# Patient Record
Sex: Female | Born: 1950 | Race: White | Hispanic: No | Marital: Married | Smoking: Never smoker
Health system: Southern US, Community
[De-identification: ages and names within clinical notes are randomized; demographics above are authoritative.]

## PROBLEM LIST (undated history)

## (undated) DIAGNOSIS — R112 Nausea with vomiting, unspecified: Secondary | ICD-10-CM

## (undated) DIAGNOSIS — E7800 Pure hypercholesterolemia, unspecified: Secondary | ICD-10-CM

## (undated) DIAGNOSIS — Z9889 Other specified postprocedural states: Secondary | ICD-10-CM

## (undated) DIAGNOSIS — T8859XA Other complications of anesthesia, initial encounter: Secondary | ICD-10-CM

## (undated) DIAGNOSIS — T4145XA Adverse effect of unspecified anesthetic, initial encounter: Secondary | ICD-10-CM

## (undated) DIAGNOSIS — I10 Essential (primary) hypertension: Secondary | ICD-10-CM

## (undated) HISTORY — PX: APPENDECTOMY: SHX54

## (undated) HISTORY — PX: ABDOMINAL HYSTERECTOMY: SHX81

---
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
## 2005-08-08 ENCOUNTER — Ambulatory Visit: Payer: Self-pay | Admitting: Unknown Physician Specialty

## 2005-08-26 ENCOUNTER — Ambulatory Visit: Payer: Self-pay | Admitting: Unknown Physician Specialty

## 2005-09-30 ENCOUNTER — Ambulatory Visit: Payer: Self-pay | Admitting: Obstetrics and Gynecology

## 2007-01-20 ENCOUNTER — Ambulatory Visit: Payer: Self-pay | Admitting: Obstetrics and Gynecology

## 2007-04-22 ENCOUNTER — Ambulatory Visit: Payer: Self-pay | Admitting: Obstetrics and Gynecology

## 2007-04-30 ENCOUNTER — Inpatient Hospital Stay: Payer: Self-pay | Admitting: Obstetrics and Gynecology

## 2007-07-17 ENCOUNTER — Ambulatory Visit: Payer: Self-pay | Admitting: Obstetrics and Gynecology

## 2008-02-13 ENCOUNTER — Inpatient Hospital Stay: Payer: Self-pay | Admitting: Internal Medicine

## 2008-02-13 ENCOUNTER — Other Ambulatory Visit: Payer: Self-pay

## 2008-03-16 ENCOUNTER — Ambulatory Visit: Payer: Self-pay | Admitting: Family Medicine

## 2009-06-06 ENCOUNTER — Ambulatory Visit: Payer: Self-pay | Admitting: Obstetrics and Gynecology

## 2010-08-27 ENCOUNTER — Ambulatory Visit: Payer: Self-pay | Admitting: Obstetrics and Gynecology

## 2011-10-16 ENCOUNTER — Ambulatory Visit: Payer: Self-pay | Admitting: Obstetrics and Gynecology

## 2011-11-05 HISTORY — PX: COLONOSCOPY: SHX174

## 2011-12-30 ENCOUNTER — Ambulatory Visit: Payer: Self-pay | Admitting: Gastroenterology

## 2012-10-20 ENCOUNTER — Ambulatory Visit: Payer: Self-pay | Admitting: Family Medicine

## 2013-11-16 ENCOUNTER — Ambulatory Visit: Payer: Self-pay | Admitting: Family Medicine

## 2014-08-08 DIAGNOSIS — E7800 Pure hypercholesterolemia, unspecified: Secondary | ICD-10-CM | POA: Insufficient documentation

## 2014-08-08 DIAGNOSIS — E119 Type 2 diabetes mellitus without complications: Secondary | ICD-10-CM | POA: Insufficient documentation

## 2015-02-23 ENCOUNTER — Ambulatory Visit
Admit: 2015-02-23 | Disposition: A | Discharge status: Home / Self Care | Payer: Self-pay | Attending: Family Medicine | Admitting: Family Medicine

## 2016-02-12 ENCOUNTER — Other Ambulatory Visit: Payer: Self-pay | Admitting: Family Medicine

## 2016-02-12 DIAGNOSIS — Z1231 Encounter for screening mammogram for malignant neoplasm of breast: Secondary | ICD-10-CM

## 2016-02-26 ENCOUNTER — Ambulatory Visit
Admission: RE | Admit: 2016-02-26 | Discharge: 2016-02-26 | Disposition: A | Discharge status: Home / Self Care | Payer: BLUE CROSS/BLUE SHIELD | Source: Ambulatory Visit | Attending: Family Medicine | Admitting: Family Medicine

## 2016-02-26 DIAGNOSIS — Z1231 Encounter for screening mammogram for malignant neoplasm of breast: Secondary | ICD-10-CM | POA: Diagnosis present

## 2017-02-13 ENCOUNTER — Other Ambulatory Visit: Payer: Self-pay | Admitting: Family Medicine

## 2017-02-13 DIAGNOSIS — Z1231 Encounter for screening mammogram for malignant neoplasm of breast: Secondary | ICD-10-CM

## 2017-03-10 ENCOUNTER — Ambulatory Visit
Admission: RE | Admit: 2017-03-10 | Discharge: 2017-03-10 | Disposition: A | Discharge status: Home / Self Care | Payer: BLUE CROSS/BLUE SHIELD | Source: Ambulatory Visit | Attending: Family Medicine | Admitting: Family Medicine

## 2017-03-10 DIAGNOSIS — Z1231 Encounter for screening mammogram for malignant neoplasm of breast: Secondary | ICD-10-CM | POA: Insufficient documentation

## 2017-08-04 DIAGNOSIS — R7303 Prediabetes: Secondary | ICD-10-CM | POA: Diagnosis not present

## 2017-08-04 DIAGNOSIS — E7800 Pure hypercholesterolemia, unspecified: Secondary | ICD-10-CM | POA: Diagnosis not present

## 2017-08-14 DIAGNOSIS — M79642 Pain in left hand: Secondary | ICD-10-CM | POA: Diagnosis not present

## 2017-08-14 DIAGNOSIS — E7800 Pure hypercholesterolemia, unspecified: Secondary | ICD-10-CM | POA: Diagnosis not present

## 2017-08-14 DIAGNOSIS — R7303 Prediabetes: Secondary | ICD-10-CM | POA: Diagnosis not present

## 2017-10-20 DIAGNOSIS — B372 Candidiasis of skin and nail: Secondary | ICD-10-CM | POA: Diagnosis not present

## 2017-11-12 DIAGNOSIS — L821 Other seborrheic keratosis: Secondary | ICD-10-CM | POA: Diagnosis not present

## 2017-11-12 DIAGNOSIS — L814 Other melanin hyperpigmentation: Secondary | ICD-10-CM | POA: Diagnosis not present

## 2018-02-09 ENCOUNTER — Other Ambulatory Visit: Payer: Self-pay | Admitting: Family Medicine

## 2018-02-09 DIAGNOSIS — E7800 Pure hypercholesterolemia, unspecified: Secondary | ICD-10-CM | POA: Diagnosis not present

## 2018-02-09 DIAGNOSIS — Z1231 Encounter for screening mammogram for malignant neoplasm of breast: Secondary | ICD-10-CM

## 2018-02-09 DIAGNOSIS — R7303 Prediabetes: Secondary | ICD-10-CM | POA: Diagnosis not present

## 2018-02-16 DIAGNOSIS — E7800 Pure hypercholesterolemia, unspecified: Secondary | ICD-10-CM | POA: Diagnosis not present

## 2018-02-16 DIAGNOSIS — Z0000 Encounter for general adult medical examination without abnormal findings: Secondary | ICD-10-CM | POA: Diagnosis not present

## 2018-02-16 DIAGNOSIS — E119 Type 2 diabetes mellitus without complications: Secondary | ICD-10-CM | POA: Diagnosis not present

## 2018-02-16 DIAGNOSIS — M542 Cervicalgia: Secondary | ICD-10-CM | POA: Diagnosis not present

## 2018-03-11 ENCOUNTER — Ambulatory Visit
Admission: RE | Admit: 2018-03-11 | Discharge: 2018-03-11 | Disposition: A | Discharge status: Home / Self Care | Payer: PPO | Source: Ambulatory Visit | Attending: Family Medicine | Admitting: Family Medicine

## 2018-03-11 DIAGNOSIS — Z1231 Encounter for screening mammogram for malignant neoplasm of breast: Secondary | ICD-10-CM | POA: Insufficient documentation

## 2018-04-03 ENCOUNTER — Other Ambulatory Visit: Payer: Self-pay

## 2018-04-03 ENCOUNTER — Emergency Department
Admission: EM | Admit: 2018-04-03 | Discharge: 2018-04-03 | Disposition: A | Discharge status: Home / Self Care | Payer: PPO | Attending: Emergency Medicine | Admitting: Emergency Medicine

## 2018-04-03 DIAGNOSIS — I10 Essential (primary) hypertension: Secondary | ICD-10-CM | POA: Diagnosis not present

## 2018-04-03 DIAGNOSIS — R42 Dizziness and giddiness: Secondary | ICD-10-CM

## 2018-04-03 DIAGNOSIS — E119 Type 2 diabetes mellitus without complications: Secondary | ICD-10-CM | POA: Insufficient documentation

## 2018-04-03 HISTORY — DX: Essential (primary) hypertension: I10

## 2018-04-03 HISTORY — DX: Pure hypercholesterolemia, unspecified: E78.00

## 2018-04-03 LAB — URINALYSIS, ROUTINE W REFLEX MICROSCOPIC
Bilirubin Urine: NEGATIVE
Glucose, UA: NEGATIVE mg/dL
Hgb urine dipstick: NEGATIVE
KETONES UR: NEGATIVE mg/dL
LEUKOCYTES UA: NEGATIVE
Nitrite: NEGATIVE
Protein, ur: NEGATIVE mg/dL
Specific Gravity, Urine: 1.011 (ref 1.005–1.030)
pH: 8 (ref 5.0–8.0)

## 2018-04-03 LAB — CBC WITH DIFFERENTIAL/PLATELET
BASOS ABS: 0.1 10*3/uL (ref 0–0.1)
BASOS PCT: 1 %
EOS ABS: 0.2 10*3/uL (ref 0–0.7)
Eosinophils Relative: 4 %
HCT: 44.1 % (ref 35.0–47.0)
Hemoglobin: 14.6 g/dL (ref 12.0–16.0)
Lymphocytes Relative: 37 %
Lymphs Abs: 2.4 10*3/uL (ref 1.0–3.6)
MCH: 28 pg (ref 26.0–34.0)
MCHC: 33.2 g/dL (ref 32.0–36.0)
MCV: 84.4 fL (ref 80.0–100.0)
Monocytes Absolute: 0.7 10*3/uL (ref 0.2–0.9)
Monocytes Relative: 11 %
NEUTROS PCT: 47 %
Neutro Abs: 3.1 10*3/uL (ref 1.4–6.5)
Platelets: 272 10*3/uL (ref 150–440)
RBC: 5.22 MIL/uL — AB (ref 3.80–5.20)
RDW: 14.5 % (ref 11.5–14.5)
WBC: 6.5 10*3/uL (ref 3.6–11.0)

## 2018-04-03 LAB — BASIC METABOLIC PANEL
Anion gap: 9 (ref 5–15)
BUN: 18 mg/dL (ref 6–20)
CO2: 25 mmol/L (ref 22–32)
Calcium: 9.5 mg/dL (ref 8.9–10.3)
Chloride: 105 mmol/L (ref 101–111)
Creatinine, Ser: 0.69 mg/dL (ref 0.44–1.00)
GFR calc non Af Amer: >60 mL/min (ref >60)
Glucose, Bld: 125 mg/dL — ABNORMAL HIGH (ref 65–99)
POTASSIUM: 3.6 mmol/L (ref 3.5–5.1)
SODIUM: 139 mmol/L (ref 135–145)

## 2018-04-03 LAB — MAGNESIUM: MAGNESIUM: 2.2 mg/dL (ref 1.7–2.4)

## 2018-04-03 LAB — GLUCOSE, CAPILLARY: Glucose-Capillary: 125 mg/dL — ABNORMAL HIGH (ref 65–99)

## 2018-04-03 NOTE — ED Triage Notes (Signed)
Pt arrived via EMS d/t dizziness throughout the night, only while standing. Pt denies any other symptoms as well as pain. Pt has hx of hypertension. Pt is A&O x4 at this time.

## 2018-04-03 NOTE — Discharge Instructions (Signed)
Your workup in the Emergency Department today was reassuring.  We did not find any specific abnormalities.  We recommend you drink plenty of fluids, take your regular medications and/or any new ones prescribed today, and follow up with the doctor(s) listed in these documents as recommended.  Return to the Emergency Department if you develop new or worsening symptoms that concern you.  

## 2018-04-03 NOTE — ED Notes (Signed)
Lab called about blood work, states they will run it

## 2018-04-03 NOTE — ED Provider Notes (Addendum)
[facility] Emergency Department Provider Note  ____________________________________________   First MD Initiated Contact with Patient 04/03/18 [phone]     (approximate)  I have reviewed the triage vital signs and the nursing notes.   HISTORY  Chief Complaint Dizziness    HPI Angela Blair is a 67 y.o. female who is generally healthy and active with medical history as listed below who presents for evaluation of dizziness when standing.  She reports that she fell asleep in her chair tonight and when she got up to go to bed around 1 AM she felt dizzy but was able to ambulate to her bed and go to sleep.  She slept for about 4 hours but then when she woke up and got up she once again felt dizzy.  She checked her blood pressure and her blood pressure was significantly elevated in the range of 180/100.  That concerned her particularly in the presence of the dizziness so she called EMS for transport.  She has been in her normal state of health recently.  She denies fever/chills, headache, neck pain, nausea, vomiting, chest pain, abdominal pain, diarrhea, and dysuria.  She has been eating and drinking normally and does not feel that she is dehydrated.  She has not had episodes of dizziness in the past.  She denies numbness, tingling, weakness, difficulty speaking, difficulty finding words, or difficulty with ambulation, simply explains that she becomes dizzy when she stands up.  She admits that she does not take her medications as she is instructed.  Dr. Charlton Haws is her primary care provider and knows that she is noncompliant with her medication, particularly her blood pressure medicine (she has been prescribed losartan 25 mg p.o. daily but does not take it).  The onset of her symptoms was acute tonight at around 1 AM after she woke up from her nap in her recliner.  She describes the symptoms as moderate in severity.  Past Medical History:  Diagnosis Date  .  Diabetes mellitus (HCC)    managed with diet  . Hypercholesterolemia   . Hypertension    mild, does not want to take medications    There are no active problems to display for this patient.   Past Surgical History:  Procedure Laterality Date  . ABDOMINAL HYSTERECTOMY    . APPENDECTOMY    . COLONOSCOPY  2013    Prior to Admission medications   Not on File    Allergies Patient has no allergy information on record.  Family History  Problem Relation Age of Onset  . Breast cancer Neg Hx     Social History Social History   Tobacco Use  . Smoking status: Never Smoker  . Smokeless tobacco: Never Used  Substance Use Topics  . Alcohol use: Not on file  . Drug use: Not on file    Review of Systems Constitutional: No fever/chills Eyes: No visual changes. ENT: No sore throat. Cardiovascular: Denies chest pain. Respiratory: Denies shortness of breath. Gastrointestinal: No abdominal pain.  No nausea, no vomiting.  No diarrhea.  No constipation. Genitourinary: Negative for dysuria. Musculoskeletal: Negative for neck pain.  Negative for back pain. Integumentary: Negative for rash. Neurological: Negative for headaches, focal weakness or numbness.   ____________________________________________   PHYSICAL EXAM:  VITAL SIGNS: ED Triage Vitals  Enc Vitals Group     BP 04/03/18 0534 (!) 182/91     Pulse Rate 04/03/18 0534 70     Resp 04/03/18 0534 15  Temp 04/03/18 0534 (!) 97.5 F (36.4 C)     Temp Source 04/03/18 0534 Oral     SpO2 04/03/18 0534 98 %     Weight 04/03/18 0536 68 kg (150 lb)     Height 04/03/18 0536 1.6 m ( )     Head Circumference --      Peak Flow --      Pain Score 04/03/18 0536 0     Pain Loc --      Pain Edu? --      Excl. in GC? --     Constitutional: Alert and oriented. Well appearing and in no acute distress. Eyes: Conjunctivae are normal. PERRL. EOMI. no nystagmus. Head: Atraumatic. Nose: No  congestion/rhinnorhea. Mouth/Throat: Mucous membranes are moist. Neck: No stridor.  No meningeal signs.   Cardiovascular: Normal rate, regular rhythm. Good peripheral circulation. Grossly normal heart sounds. Respiratory: Normal respiratory effort.  No retractions. Lungs CTAB. Gastrointestinal: Soft and nontender. No distention.  Musculoskeletal: No lower extremity tenderness nor edema. No gross deformities of extremities. Neurologic:  Normal speech and language. No gross focal neurologic deficits are appreciated.  No ataxia.  Normal major muscle group strength throughout.  Negative Romberg, negative pronator drift, no dysmetria. Skin:  Skin is warm, dry and intact. No rash noted. Psychiatric: Mood and affect are normal. Speech and behavior are normal.  ____________________________________________   LABS (all labs ordered are listed, but only abnormal results are displayed)  Labs Reviewed  GLUCOSE, CAPILLARY - Abnormal; Notable for the following components:      Result Value   Glucose-Capillary 125 (*)    All other components within normal limits  CBC WITH DIFFERENTIAL/PLATELET - Abnormal; Notable for the following components:   RBC 5.22 (*)    All other components within normal limits  BASIC METABOLIC PANEL - Abnormal; Notable for the following components:   Glucose, Bld 125 (*)    All other components within normal limits  URINALYSIS, ROUTINE W REFLEX MICROSCOPIC - Abnormal; Notable for the following components:   Color, Urine YELLOW (*)    APPearance CLOUDY (*)    All other components within normal limits  MAGNESIUM   ____________________________________________  EKG  ED ECG REPORT I, Loleta Rose, the attending physician, personally viewed and interpreted this ECG.  Date: 04/03/2018 EKG Time: 5:35 AM Rate: 72 Rhythm: normal sinus rhythm with rare premature ventricular complex QRS Axis: normal Intervals: normal ST/T Wave abnormalities: Non-specific ST segment / T-wave  changes, but no evidence of acute ischemia. Narrative Interpretation: no evidence of acute ischemia   ____________________________________________  RADIOLOGY   ED MD interpretation: No indication for imaging  Official radiology report(s): No results found.  ____________________________________________   PROCEDURES  Critical Care performed: No   Procedure(s) performed:   Procedures   ____________________________________________   INITIAL IMPRESSION / ASSESSMENT AND PLAN / ED COURSE  As part of my medical decision making, I reviewed the following data within the electronic MEDICAL RECORD NUMBER Nursing notes reviewed and incorporated, Labs reviewed , EKG interpreted  and Old chart reviewed    Differential diagnosis includes, but is not limited to, orthostatic relative hypotension, metabolic or electrolyte abnormality, acute infection, CVA, peripheral vertigo.  The patient is very well-appearing and in no acute distress at this time with normal vital signs except for her hypertension which has improved since she is come to the emergency department.  I asked staff to obtain orthostatic vital signs, and notably her systolic blood pressure dropped 20 points from lying to  standing, but interestingly she denied feeling dizzy this time when she stood up.  She is currently asymptomatic.  I am awaiting blood work to make sure there is no evidence of any acute electrolyte abnormalities and I explained that she could eat and drink something in the ED if she would like to do so.  I doubt that there is no emergent medical condition at this time and she has no focal neurological deficits.  No indication for imaging of her head, neither CT nor MRI.  The patient is comfortable with plan for outpatient follow-up if her work-up is reassuring.   Clinical Course as of Apr 03 716  Fri Apr 03, 2018  [id number] Lab work is all reassuring with a normal CBC, basic metabolic panel, and magnesium level.  Urine  sample has not yet been provided but the patient has not been having any dysuria.   [CF]  [phone] The patient is ambulatory to and from the commode without any dizziness.  She has no ataxia and a normal neurological exam as described above.  She is comfortable with plan for discharge and outpatient follow-up.  I encouraged plenty of p.o. fluids, starting back on her medication as prescribed by her PCP, and close outpatient follow-up.  I gave my usual and customary return precautions.  She understands and agrees with the plan.   [CF]  0717 No evidence of infection.  Urinalysis, Routine w reflex microscopic(!) [CF]    Clinical Course User Index [CF] Loleta Rose, MD    ____________________________________________  FINAL CLINICAL IMPRESSION(S) / ED DIAGNOSES  Final diagnoses:  Dizziness  Essential hypertension     MEDICATIONS GIVEN DURING THIS VISIT:  Medications - No data to display   ED Discharge Orders    None       Note:  This document was prepared using Dragon voice recognition software and may include unintentional dictation errors.    Loleta Rose, MD 04/03/18 4132    Loleta Rose, MD 04/03/18 [phone]

## 2018-04-06 DIAGNOSIS — I10 Essential (primary) hypertension: Secondary | ICD-10-CM | POA: Diagnosis not present

## 2018-05-04 ENCOUNTER — Encounter: Payer: Self-pay | Admitting: Emergency Medicine

## 2018-05-04 ENCOUNTER — Inpatient Hospital Stay
Admission: EM | Admit: 2018-05-04 | Discharge: 2018-05-13 | DRG: 329 | Disposition: A | Discharge status: Home / Self Care | Payer: PPO | Attending: Surgery | Admitting: Surgery

## 2018-05-04 DIAGNOSIS — R140 Abdominal distension (gaseous): Secondary | ICD-10-CM | POA: Diagnosis not present

## 2018-05-04 DIAGNOSIS — K567 Ileus, unspecified: Secondary | ICD-10-CM | POA: Diagnosis not present

## 2018-05-04 DIAGNOSIS — Y9389 Activity, other specified: Secondary | ICD-10-CM | POA: Diagnosis not present

## 2018-05-04 DIAGNOSIS — K631 Perforation of intestine (nontraumatic): Principal | ICD-10-CM

## 2018-05-04 DIAGNOSIS — Z7982 Long term (current) use of aspirin: Secondary | ICD-10-CM

## 2018-05-04 DIAGNOSIS — K658 Other peritonitis: Secondary | ICD-10-CM | POA: Diagnosis present

## 2018-05-04 DIAGNOSIS — Z79899 Other long term (current) drug therapy: Secondary | ICD-10-CM | POA: Diagnosis not present

## 2018-05-04 DIAGNOSIS — Z23 Encounter for immunization: Secondary | ICD-10-CM | POA: Diagnosis present

## 2018-05-04 DIAGNOSIS — K432 Incisional hernia without obstruction or gangrene: Secondary | ICD-10-CM | POA: Diagnosis present

## 2018-05-04 DIAGNOSIS — Z9049 Acquired absence of other specified parts of digestive tract: Secondary | ICD-10-CM | POA: Diagnosis not present

## 2018-05-04 DIAGNOSIS — R100 Acute abdomen: Secondary | ICD-10-CM | POA: Diagnosis not present

## 2018-05-04 DIAGNOSIS — R112 Nausea with vomiting, unspecified: Secondary | ICD-10-CM | POA: Diagnosis not present

## 2018-05-04 DIAGNOSIS — R109 Unspecified abdominal pain: Secondary | ICD-10-CM | POA: Diagnosis not present

## 2018-05-04 DIAGNOSIS — T183XXA Foreign body in small intestine, initial encounter: Secondary | ICD-10-CM | POA: Diagnosis present

## 2018-05-04 DIAGNOSIS — Z90710 Acquired absence of both cervix and uterus: Secondary | ICD-10-CM

## 2018-05-04 DIAGNOSIS — I10 Essential (primary) hypertension: Secondary | ICD-10-CM | POA: Diagnosis present

## 2018-05-04 DIAGNOSIS — K660 Peritoneal adhesions (postprocedural) (postinfection): Secondary | ICD-10-CM | POA: Diagnosis present

## 2018-05-04 DIAGNOSIS — E7800 Pure hypercholesterolemia, unspecified: Secondary | ICD-10-CM | POA: Diagnosis present

## 2018-05-04 DIAGNOSIS — E119 Type 2 diabetes mellitus without complications: Secondary | ICD-10-CM | POA: Diagnosis present

## 2018-05-04 DIAGNOSIS — K9189 Other postprocedural complications and disorders of digestive system: Secondary | ICD-10-CM

## 2018-05-04 DIAGNOSIS — X58XXXA Exposure to other specified factors, initial encounter: Secondary | ICD-10-CM | POA: Diagnosis present

## 2018-05-04 DIAGNOSIS — T189XXA Foreign body of alimentary tract, part unspecified, initial encounter: Secondary | ICD-10-CM | POA: Diagnosis not present

## 2018-05-04 DIAGNOSIS — R1110 Vomiting, unspecified: Secondary | ICD-10-CM | POA: Diagnosis not present

## 2018-05-04 LAB — CBC WITH DIFFERENTIAL/PLATELET
BASOS ABS: 0 10*3/uL (ref 0–0.1)
BASOS PCT: 0 %
EOS PCT: 0 %
Eosinophils Absolute: 0 10*3/uL (ref 0–0.7)
HCT: 45.3 % (ref 35.0–47.0)
Hemoglobin: 15.3 g/dL (ref 12.0–16.0)
Lymphocytes Relative: 7 %
Lymphs Abs: 1.1 10*3/uL (ref 1.0–3.6)
MCH: 28.2 pg (ref 26.0–34.0)
MCHC: 33.8 g/dL (ref 32.0–36.0)
MCV: 83.3 fL (ref 80.0–100.0)
Monocytes Absolute: 1.1 10*3/uL — ABNORMAL HIGH (ref 0.2–0.9)
Monocytes Relative: 7 %
NEUTROS ABS: 13.3 10*3/uL — AB (ref 1.4–6.5)
Neutrophils Relative %: 86 %
PLATELETS: 287 10*3/uL (ref 150–440)
RBC: 5.44 MIL/uL — ABNORMAL HIGH (ref 3.80–5.20)
RDW: 14.4 % (ref 11.5–14.5)
WBC: 15.6 10*3/uL — ABNORMAL HIGH (ref 3.6–11.0)

## 2018-05-04 LAB — COMPREHENSIVE METABOLIC PANEL
ALBUMIN: 4.6 g/dL (ref 3.5–5.0)
ALT: 25 U/L (ref 0–44)
AST: 26 U/L (ref 15–41)
Alkaline Phosphatase: 69 U/L (ref 38–126)
Anion gap: 13 (ref 5–15)
BUN: 22 mg/dL (ref 8–23)
CHLORIDE: 105 mmol/L (ref 98–111)
CO2: 23 mmol/L (ref 22–32)
Calcium: 10 mg/dL (ref 8.9–10.3)
Creatinine, Ser: 0.75 mg/dL (ref 0.44–1.00)
GFR calc Af Amer: >60 mL/min (ref >60)
GFR calc non Af Amer: >60 mL/min (ref >60)
Glucose, Bld: 173 mg/dL — ABNORMAL HIGH (ref 70–99)
POTASSIUM: 3.9 mmol/L (ref 3.5–5.1)
SODIUM: 141 mmol/L (ref 135–145)
Total Bilirubin: 1.1 mg/dL (ref 0.3–1.2)
Total Protein: 7.4 g/dL (ref 6.5–8.1)

## 2018-05-04 LAB — LIPASE, BLOOD: Lipase: 33 U/L (ref 11–51)

## 2018-05-04 MED ORDER — ONDANSETRON HCL 4 MG/2ML IJ SOLN
4.0000 mg | Freq: Once | INTRAMUSCULAR | Status: AC
  Administered 2018-05-04: 4 mg via INTRAVENOUS
  Filled 2018-05-04: qty 2

## 2018-05-04 MED ORDER — MORPHINE SULFATE (PF) 2 MG/ML IV SOLN
2.0000 mg | Freq: Once | INTRAVENOUS | Status: AC
  Administered 2018-05-04: 2 mg via INTRAVENOUS
  Filled 2018-05-04: qty 1

## 2018-05-04 NOTE — ED Triage Notes (Signed)
Pt c/o lower abdominal pain since 1900 tonight. Pt having N/V but denies diarrhea.

## 2018-05-04 NOTE — ED Provider Notes (Signed)
[facility] Emergency Department Provider Note __________________________   First MD Initiated Contact with Patient 05/04/18 2311     (approximate)  I have reviewed the triage vital signs and the nursing notes.   HISTORY  Chief Complaint Abdominal Pain; Nausea; and Emesis    HPI Angela Blair is a 67 y.o. female with below list of chronic medical conditions presents to the emergency department with acute onset of generalized abdominal pain which is currently 10 out of 10 accompanied by vomiting which began at 7 PM.  Area.  Patient denies any urinary symptoms.  Past Medical History:  Diagnosis Date  . Diabetes mellitus (HCC)    managed with diet  . Hypercholesterolemia   . Hypertension    mild, does not want to take medications    Patient Active Problem List   Diagnosis Date Noted  . Acute abdomen     Past Surgical History:  Procedure Laterality Date  . ABDOMINAL HYSTERECTOMY    . APPENDECTOMY    . COLONOSCOPY  2013    Prior to Admission medications   Medication Sig Start Date End Date Taking? Authorizing Provider  aspirin EC 81 MG tablet Take 81 mg by mouth daily.   Yes [provider]  calcium-vitamin D (OSCAL WITH D) 500-200 MG-UNIT TABS tablet Take 1 tablet by mouth daily.   Yes [provider]  ezetimibe (ZETIA) 10 MG tablet Take 10 mg by mouth daily. 02/16/18 02/16/19 Yes [provider]  losartan (COZAAR) 50 MG tablet Take 50 mg by mouth daily. 04/06/18 04/06/19 Yes [provider]  lovastatin (MEVACOR) 40 MG tablet Take 40 mg by mouth daily. 08/14/17 08/14/18 Yes [provider]    Allergies No known drug allergies  Family History  Problem Relation Age of Onset  . Breast cancer Neg Hx     Social History Social History   Tobacco Use  . Smoking status: Never Smoker  . Smokeless tobacco: Never Used  Substance Use Topics  . Alcohol use: Not on file  . Drug use: Not on file     Review of Systems Constitutional: No fever/chills Eyes: No visual changes. ENT: No sore throat. Cardiovascular: Denies chest pain. Respiratory: Denies shortness of breath. Gastrointestinal: Positive for abdominal pain and vomiting Genitourinary: Negative for dysuria. Musculoskeletal: Negative for neck pain.  Negative for back pain. Integumentary: Negative for rash. Neurological: Negative for headaches, focal weakness or numbness.   ____________________________________________   PHYSICAL EXAM:  VITAL SIGNS: ED Triage Vitals  Enc Vitals Group     BP 05/04/18 2210 135/68     Pulse Rate 05/04/18 2210 75     Resp 05/04/18 2210 16     Temp 05/04/18 2210 98.1 F (36.7 C)     Temp Source 05/04/18 2210 Oral     SpO2 05/04/18 2210 100 %     Weight 05/04/18 2209 67.6 kg (149 lb)     Height 05/04/18 2209 1.575 m (5\' 2" )     Head Circumference --      Peak Flow --      Pain Score 05/04/18 2225 6     Pain Loc --      Pain Edu? --      Excl. in GC? --     Constitutional: Alert and oriented.  Apparent discomfort.  Eyes: Conjunctivae are normal.  Head: Atraumatic. Mouth/Throat: Mucous membranes are moist.  Oropharynx non-erythematous. Neck: No stridor.   Cardiovascular: Normal rate, regular rhythm. Good peripheral circulation. Grossly  normal heart sounds. Respiratory: Normal respiratory effort.  No retractions. Lungs CTAB. Gastrointestinal: Positive for generalized abdominal pain.positive rebound Musculoskeletal: No lower extremity tenderness nor edema. No gross deformities of extremities. Neurologic:  Normal speech and language. No gross focal neurologic deficits are appreciated.  Skin:  Skin is warm, dry and intact. No rash noted. Psychiatric: Mood and affect are normal. Speech and behavior are normal.  ____________________________________________   LABS (all labs ordered are listed, but only abnormal results are displayed)  Labs Reviewed  CBC WITH DIFFERENTIAL/PLATELET  - Abnormal; Notable for the following components:      Result Value   WBC 15.6 (*)    RBC 5.44 (*)    Neutro Abs 13.3 (*)    Monocytes Absolute 1.1 (*)    All other components within normal limits  COMPREHENSIVE METABOLIC PANEL - Abnormal; Notable for the following components:   Glucose, Bld 173 (*)    All other components within normal limits  LIPASE, BLOOD  URINALYSIS, COMPLETE (UACMP) WITH MICROSCOPIC   ____________________________________________  EKG  ED ECG REPORT I, [place] N Mical Kicklighter, the attending physician, personally viewed and interpreted this ECG.   Date: 05/05/2018  EKG Time: 11:42 PM  Rate: 69  Rhythm: Normal sinus rhythm  Axis: Normal  Intervals: Normal  ST&T Change: None  ____________________________________________  RADIOLOGY I, [place] N Llesenia Fogal, personally viewed and evaluated these images (plain radiographs) as part of my medical decision making, as well as reviewing the written report by the radiologist.  ED MD interpretation: Foreign body noted in the distal jejunum or lower quadrant with surrounding inflammation.  Official radiology report(s): Ct Abdomen Pelvis W Contrast  Result Date: 05/05/2018 CLINICAL DATA:  Abdominal pain nausea and vomiting. EXAM: CT ABDOMEN AND PELVIS WITH CONTRAST TECHNIQUE: Multidetector CT imaging of the abdomen and pelvis was performed using the standard protocol following bolus administration of intravenous contrast. CONTRAST:  75mL OMNIPAQUE IOHEXOL 300 MG/ML  SOLN COMPARISON:  None. FINDINGS: LOWER CHEST: No basilar pulmonary nodules or pleural effusion. No apical pericardial effusion. HEPATOBILIARY: Normal hepatic contours and density. No intra- or extrahepatic biliary dilatation. Normal gallbladder. PANCREAS: Normal parenchymal contours without ductal dilatation. No peripancreatic fluid collection. SPLEEN: Normal. ADRENALS/URINARY TRACT: --Adrenal glands: Normal. --Right kidney/ureter: No hydronephrosis,  nephroureterolithiasis, perinephric stranding or solid renal mass. --Left kidney/ureter: No hydronephrosis, nephroureterolithiasis, perinephric stranding or solid renal mass. --Urinary bladder: Normal for degree of distention STOMACH/BOWEL: --Stomach/Duodenum: Small sliding hiatal hernia. Normal course of the duodenum. --Small bowel: There is a linear metallic density within a loop of right anterior lower quadrant small bowel (coronal image 20). There is surrounding inflammatory stranding.No free intraperitoneal air. --Colon: There is rectosigmoid diverticulosis without acute inflammation. --Appendix: Surgically absent. VASCULAR/LYMPHATIC: Atherosclerotic calcification is present within the non-aneurysmal abdominal aorta, without hemodynamically significant stenosis. The portal vein, splenic vein, superior mesenteric vein and IVC are patent. No abdominal or pelvic lymphadenopathy. REPRODUCTIVE: Status post hysterectomy. No adnexal mass. MUSCULOSKELETAL. No bony spinal canal stenosis or focal osseous abnormality. OTHER: None. IMPRESSION: 1. Metallic foreign body within the distal jejunum, in the anterior right lower quadrant, with surrounding inflammatory change of the peritoneal fat. The exact nature of the foreign body is unclear. An abdominal radiograph might be helpful for identification. 2. No free intraperitoneal air. 3.  Aortic Atherosclerosis (ICD10-I70.0). Electronically Signed   By: Deatra Robinson M.D.   On: 05/05/2018 01:30      Procedures   ____________________________________________   INITIAL IMPRESSION / ASSESSMENT AND PLAN / ED COURSE  As part of my  medical decision making, I reviewed the following data within the electronic MEDICAL RECORD NUMBER   67 year old female presented with above-stated history and physical exam secondary to abdominal pain with vomiting.  Concern for possible peritonitis such CT scan of the abdomen was performed which revealed a foreign body in the distal jejunum with  surrounding inflammation.  Raising concern for possible perforated viscus and as such patient discussed with Dr. Everlene FarrierPabon general surgeon who evaluated patient emergency department with plan to take the patient to the operating room for exploratory laparotomy.  Patient given IV Zosyn 3.375 mg at Dr. Everlene FarrierPabon request.  Patient was also given IV morphine and Zofran while in the emergency department with mild improvement of pain. ____________________________________________  FINAL CLINICAL IMPRESSION(S) / ED DIAGNOSES  Final diagnoses:  Bowel perforation (HCC)     MEDICATIONS GIVEN DURING THIS VISIT:  Medications  piperacillin-tazobactam (ZOSYN) IVPB 3.375 g (3.375 g Intravenous New Bag/Given 05/05/18 0330)  morphine 2 MG/ML injection (has no administration in time range)  morphine 2 MG/ML injection 2 mg (2 mg Intravenous Given 05/04/18 2307)  ondansetron (ZOFRAN) injection 4 mg (4 mg Intravenous Given 05/04/18 2307)  iohexol (OMNIPAQUE) 300 MG/ML solution 75 mL (75 mLs Intravenous Contrast Given 05/05/18 0042)  morphine 2 MG/ML injection 2 mg (2 mg Intravenous Given 05/05/18 0340)     ED Discharge Orders    None       Note:  This document was prepared using Dragon voice recognition software and may include unintentional dictation errors.    Darci CurrentBrown, [place] N, MD 05/05/18 [phone]

## 2018-05-04 NOTE — ED Notes (Signed)
Pt states that she has been nauseated and vomiting today since 1900. Pt states that she is is not nauseated at this time but is having stomach pain.

## 2018-05-05 ENCOUNTER — Emergency Department: Payer: PPO

## 2018-05-05 ENCOUNTER — Other Ambulatory Visit: Payer: Self-pay

## 2018-05-05 ENCOUNTER — Encounter: Payer: Self-pay | Admitting: Radiology

## 2018-05-05 ENCOUNTER — Encounter
Admission: EM | Disposition: A | Discharge status: Home / Self Care | Payer: Self-pay | Source: Home / Self Care | Attending: Surgery

## 2018-05-05 ENCOUNTER — Emergency Department: Payer: PPO | Admitting: Anesthesiology

## 2018-05-05 DIAGNOSIS — I10 Essential (primary) hypertension: Secondary | ICD-10-CM | POA: Diagnosis present

## 2018-05-05 DIAGNOSIS — K631 Perforation of intestine (nontraumatic): Secondary | ICD-10-CM | POA: Diagnosis present

## 2018-05-05 DIAGNOSIS — Z79899 Other long term (current) drug therapy: Secondary | ICD-10-CM | POA: Diagnosis not present

## 2018-05-05 DIAGNOSIS — E7800 Pure hypercholesterolemia, unspecified: Secondary | ICD-10-CM | POA: Diagnosis present

## 2018-05-05 DIAGNOSIS — R100 Acute abdomen: Secondary | ICD-10-CM | POA: Insufficient documentation

## 2018-05-05 DIAGNOSIS — K658 Other peritonitis: Secondary | ICD-10-CM | POA: Diagnosis present

## 2018-05-05 DIAGNOSIS — E119 Type 2 diabetes mellitus without complications: Secondary | ICD-10-CM | POA: Diagnosis present

## 2018-05-05 DIAGNOSIS — Z23 Encounter for immunization: Secondary | ICD-10-CM | POA: Diagnosis present

## 2018-05-05 DIAGNOSIS — X58XXXA Exposure to other specified factors, initial encounter: Secondary | ICD-10-CM | POA: Diagnosis present

## 2018-05-05 DIAGNOSIS — Z90710 Acquired absence of both cervix and uterus: Secondary | ICD-10-CM | POA: Diagnosis not present

## 2018-05-05 DIAGNOSIS — Z7982 Long term (current) use of aspirin: Secondary | ICD-10-CM | POA: Diagnosis not present

## 2018-05-05 DIAGNOSIS — K567 Ileus, unspecified: Secondary | ICD-10-CM | POA: Diagnosis not present

## 2018-05-05 DIAGNOSIS — T183XXA Foreign body in small intestine, initial encounter: Secondary | ICD-10-CM | POA: Diagnosis present

## 2018-05-05 DIAGNOSIS — Z9049 Acquired absence of other specified parts of digestive tract: Secondary | ICD-10-CM | POA: Diagnosis not present

## 2018-05-05 DIAGNOSIS — K660 Peritoneal adhesions (postprocedural) (postinfection): Secondary | ICD-10-CM | POA: Diagnosis present

## 2018-05-05 DIAGNOSIS — Y9389 Activity, other specified: Secondary | ICD-10-CM | POA: Diagnosis not present

## 2018-05-05 DIAGNOSIS — K432 Incisional hernia without obstruction or gangrene: Secondary | ICD-10-CM | POA: Diagnosis present

## 2018-05-05 HISTORY — PX: LAPAROTOMY: SHX154

## 2018-05-05 LAB — CBC
HCT: 41.3 % (ref 35.0–47.0)
HEMOGLOBIN: 13.6 g/dL (ref 12.0–16.0)
MCH: 28 pg (ref 26.0–34.0)
MCHC: 33 g/dL (ref 32.0–36.0)
MCV: 84.8 fL (ref 80.0–100.0)
PLATELETS: 258 10*3/uL (ref 150–440)
RBC: 4.87 MIL/uL (ref 3.80–5.20)
RDW: 14 % (ref 11.5–14.5)
WBC: 11.4 10*3/uL — ABNORMAL HIGH (ref 3.6–11.0)

## 2018-05-05 LAB — GLUCOSE, CAPILLARY: GLUCOSE-CAPILLARY: 182 mg/dL — AB (ref 70–99)

## 2018-05-05 LAB — CREATININE, SERUM
CREATININE: 0.56 mg/dL (ref 0.44–1.00)
GFR calc Af Amer: >60 mL/min (ref >60)
GFR calc non Af Amer: >60 mL/min (ref >60)

## 2018-05-05 SURGERY — LAPAROTOMY, EXPLORATORY
Anesthesia: General | Site: Abdomen | Wound class: Contaminated

## 2018-05-05 MED ORDER — IOHEXOL 300 MG/ML  SOLN
75.0000 mL | Freq: Once | INTRAMUSCULAR | Status: AC | PRN
  Administered 2018-05-05: 75 mL via INTRAVENOUS

## 2018-05-05 MED ORDER — KETOROLAC TROMETHAMINE 30 MG/ML IJ SOLN
INTRAMUSCULAR | Status: AC
  Filled 2018-05-05: qty 1

## 2018-05-05 MED ORDER — ACETAMINOPHEN 500 MG PO TABS
1000.0000 mg | ORAL_TABLET | Freq: Four times a day (QID) | ORAL | Status: DC
  Administered 2018-05-05 – 2018-05-13 (×22): 1000 mg via ORAL
  Filled 2018-05-05 (×26): qty 2

## 2018-05-05 MED ORDER — SODIUM CHLORIDE 0.9 % IV SOLN
3.0000 g | Freq: Four times a day (QID) | INTRAVENOUS | Status: AC
  Administered 2018-05-05 (×3): 3 g via INTRAVENOUS
  Filled 2018-05-05 (×3): qty 3

## 2018-05-05 MED ORDER — GABAPENTIN 600 MG PO TABS
300.0000 mg | ORAL_TABLET | Freq: Three times a day (TID) | ORAL | Status: DC
  Administered 2018-05-05 – 2018-05-08 (×10): 300 mg via ORAL
  Filled 2018-05-05 (×12): qty 1

## 2018-05-05 MED ORDER — ONDANSETRON HCL 4 MG/2ML IJ SOLN: 4.0000 mg | Freq: Once | INTRAMUSCULAR | Status: DC | PRN

## 2018-05-05 MED ORDER — SODIUM CHLORIDE 0.9 % IV SOLN
INTRAVENOUS | Status: DC | PRN
  Administered 2018-05-05: 70 mL

## 2018-05-05 MED ORDER — PROPOFOL 10 MG/ML IV BOLUS
INTRAVENOUS | Status: DC | PRN
  Administered 2018-05-05: 150 mg via INTRAVENOUS

## 2018-05-05 MED ORDER — SODIUM CHLORIDE 0.9 % IJ SOLN
INTRAMUSCULAR | Status: AC
  Filled 2018-05-05: qty 50

## 2018-05-05 MED ORDER — EPHEDRINE SULFATE 50 MG/ML IJ SOLN
INTRAMUSCULAR | Status: AC
  Filled 2018-05-05: qty 1

## 2018-05-05 MED ORDER — LACTATED RINGERS IV SOLN
INTRAVENOUS | Status: DC
  Administered 2018-05-05 – 2018-05-06 (×3): via INTRAVENOUS

## 2018-05-05 MED ORDER — KETOROLAC TROMETHAMINE 30 MG/ML IJ SOLN
30.0000 mg | Freq: Four times a day (QID) | INTRAMUSCULAR | Status: AC
  Administered 2018-05-05 – 2018-05-10 (×19): 30 mg via INTRAVENOUS
  Filled 2018-05-05 (×19): qty 1

## 2018-05-05 MED ORDER — ENOXAPARIN SODIUM 40 MG/0.4ML ~~LOC~~ SOLN
40.0000 mg | SUBCUTANEOUS | Status: DC
  Administered 2018-05-06 – 2018-05-11 (×6): 40 mg via SUBCUTANEOUS
  Filled 2018-05-05 (×6): qty 0.4

## 2018-05-05 MED ORDER — SUCCINYLCHOLINE CHLORIDE 20 MG/ML IJ SOLN
INTRAMUSCULAR | Status: AC
  Filled 2018-05-05: qty 1

## 2018-05-05 MED ORDER — FENTANYL CITRATE (PF) 100 MCG/2ML IJ SOLN
INTRAMUSCULAR | Status: AC
  Administered 2018-05-05: 25 ug via INTRAVENOUS
  Filled 2018-05-05: qty 2

## 2018-05-05 MED ORDER — DIPHENHYDRAMINE HCL 12.5 MG/5ML PO ELIX
12.5000 mg | ORAL_SOLUTION | Freq: Four times a day (QID) | ORAL | Status: DC | PRN
  Filled 2018-05-05: qty 5

## 2018-05-05 MED ORDER — FENTANYL CITRATE (PF) 100 MCG/2ML IJ SOLN
25.0000 ug | INTRAMUSCULAR | Status: AC | PRN
  Administered 2018-05-05 (×6): 25 ug via INTRAVENOUS

## 2018-05-05 MED ORDER — BUPIVACAINE-EPINEPHRINE (PF) 0.25% -1:200000 IJ SOLN
INTRAMUSCULAR | Status: DC | PRN
  Administered 2018-05-05: 30 mL via PERINEURAL

## 2018-05-05 MED ORDER — ROCURONIUM BROMIDE 50 MG/5ML IV SOLN
INTRAVENOUS | Status: AC
Start: 2018-05-05 — End: ?
  Filled 2018-05-05: qty 1

## 2018-05-05 MED ORDER — ONDANSETRON HCL 4 MG/2ML IJ SOLN
INTRAMUSCULAR | Status: AC
  Filled 2018-05-05: qty 2

## 2018-05-05 MED ORDER — MORPHINE SULFATE (PF) 4 MG/ML IV SOLN
4.0000 mg | INTRAVENOUS | Status: DC | PRN
  Administered 2018-05-05 (×2): 4 mg via INTRAVENOUS
  Filled 2018-05-05 (×2): qty 1

## 2018-05-05 MED ORDER — MORPHINE SULFATE (PF) 2 MG/ML IV SOLN
INTRAVENOUS | Status: AC
  Filled 2018-05-05: qty 1

## 2018-05-05 MED ORDER — SEVOFLURANE IN SOLN
RESPIRATORY_TRACT | Status: AC
  Filled 2018-05-05: qty 250

## 2018-05-05 MED ORDER — PIPERACILLIN-TAZOBACTAM 3.375 G IVPB 30 MIN
3.3750 g | Freq: Once | INTRAVENOUS | Status: AC
  Administered 2018-05-05: 3.375 g via INTRAVENOUS
  Filled 2018-05-05: qty 50

## 2018-05-05 MED ORDER — LOSARTAN POTASSIUM 50 MG PO TABS
50.0000 mg | ORAL_TABLET | Freq: Every day | ORAL | Status: DC
  Administered 2018-05-05 – 2018-05-13 (×9): 50 mg via ORAL
  Filled 2018-05-05 (×9): qty 1

## 2018-05-05 MED ORDER — FENTANYL CITRATE (PF) 100 MCG/2ML IJ SOLN
INTRAMUSCULAR | Status: DC | PRN
  Administered 2018-05-05 (×4): 50 ug via INTRAVENOUS

## 2018-05-05 MED ORDER — SUGAMMADEX SODIUM 200 MG/2ML IV SOLN
INTRAVENOUS | Status: DC | PRN
  Administered 2018-05-05: 140 mg via INTRAVENOUS

## 2018-05-05 MED ORDER — ONDANSETRON 4 MG PO TBDP
4.0000 mg | ORAL_TABLET | Freq: Four times a day (QID) | ORAL | Status: DC | PRN
  Administered 2018-05-10: 4 mg via ORAL
  Filled 2018-05-05: qty 1

## 2018-05-05 MED ORDER — HYDRALAZINE HCL 20 MG/ML IJ SOLN: 10.0000 mg | INTRAMUSCULAR | Status: DC | PRN

## 2018-05-05 MED ORDER — BUPIVACAINE LIPOSOME 1.3 % IJ SUSP
INTRAMUSCULAR | Status: AC
  Filled 2018-05-05: qty 20

## 2018-05-05 MED ORDER — MORPHINE SULFATE (PF) 2 MG/ML IV SOLN: 2.0000 mg | Freq: Once | INTRAVENOUS | Status: DC

## 2018-05-05 MED ORDER — LIDOCAINE HCL (CARDIAC) PF 100 MG/5ML IV SOSY
PREFILLED_SYRINGE | INTRAVENOUS | Status: DC | PRN
  Administered 2018-05-05: 30 mg via INTRAVENOUS

## 2018-05-05 MED ORDER — DIPHENHYDRAMINE HCL 50 MG/ML IJ SOLN: 12.5000 mg | Freq: Four times a day (QID) | INTRAMUSCULAR | Status: DC | PRN

## 2018-05-05 MED ORDER — KETOROLAC TROMETHAMINE 30 MG/ML IJ SOLN
INTRAMUSCULAR | Status: DC | PRN
  Administered 2018-05-05: 30 mg via INTRAVENOUS

## 2018-05-05 MED ORDER — ACETAMINOPHEN 10 MG/ML IV SOLN
INTRAVENOUS | Status: AC
  Filled 2018-05-05: qty 100

## 2018-05-05 MED ORDER — ONDANSETRON HCL 4 MG/2ML IJ SOLN
INTRAMUSCULAR | Status: DC | PRN
  Administered 2018-05-05: 4 mg via INTRAVENOUS

## 2018-05-05 MED ORDER — ACETAMINOPHEN 10 MG/ML IV SOLN
INTRAVENOUS | Status: DC | PRN
  Administered 2018-05-05: 1000 mg via INTRAVENOUS

## 2018-05-05 MED ORDER — PNEUMOCOCCAL VAC POLYVALENT 25 MCG/0.5ML IJ INJ
0.5000 mL | INJECTION | INTRAMUSCULAR | Status: AC
  Administered 2018-05-06: 0.5 mL via INTRAMUSCULAR
  Filled 2018-05-05: qty 0.5

## 2018-05-05 MED ORDER — ONDANSETRON HCL 4 MG/2ML IJ SOLN
4.0000 mg | Freq: Four times a day (QID) | INTRAMUSCULAR | Status: DC | PRN
  Administered 2018-05-05 – 2018-05-09 (×7): 4 mg via INTRAVENOUS
  Filled 2018-05-05 (×9): qty 2

## 2018-05-05 MED ORDER — MIDAZOLAM HCL 2 MG/2ML IJ SOLN
INTRAMUSCULAR | Status: DC | PRN
  Administered 2018-05-05: 2 mg via INTRAVENOUS

## 2018-05-05 MED ORDER — OXYCODONE HCL 5 MG PO TABS
5.0000 mg | ORAL_TABLET | ORAL | Status: DC | PRN
  Administered 2018-05-05: 5 mg via ORAL
  Administered 2018-05-06 (×2): 10 mg via ORAL
  Filled 2018-05-05: qty 2
  Filled 2018-05-05: qty 1
  Filled 2018-05-05: qty 2

## 2018-05-05 MED ORDER — BUPIVACAINE-EPINEPHRINE (PF) 0.25% -1:200000 IJ SOLN
INTRAMUSCULAR | Status: AC
  Filled 2018-05-05: qty 30

## 2018-05-05 MED ORDER — MORPHINE SULFATE (PF) 2 MG/ML IV SOLN
2.0000 mg | Freq: Once | INTRAVENOUS | Status: AC
  Administered 2018-05-05: 2 mg via INTRAVENOUS

## 2018-05-05 MED ORDER — SUCCINYLCHOLINE CHLORIDE 20 MG/ML IJ SOLN
INTRAMUSCULAR | Status: DC | PRN
  Administered 2018-05-05: 100 mg via INTRAVENOUS

## 2018-05-05 MED ORDER — FENTANYL CITRATE (PF) 100 MCG/2ML IJ SOLN
INTRAMUSCULAR | Status: AC
  Filled 2018-05-05: qty 2

## 2018-05-05 MED ORDER — MIDAZOLAM HCL 2 MG/2ML IJ SOLN
INTRAMUSCULAR | Status: AC
  Filled 2018-05-05: qty 2

## 2018-05-05 MED ORDER — FAMOTIDINE IN NACL 20-0.9 MG/50ML-% IV SOLN
20.0000 mg | Freq: Two times a day (BID) | INTRAVENOUS | Status: DC
  Administered 2018-05-05 – 2018-05-09 (×9): 20 mg via INTRAVENOUS
  Filled 2018-05-05 (×9): qty 50

## 2018-05-05 MED ORDER — LACTATED RINGERS IV SOLN
INTRAVENOUS | Status: DC | PRN
  Administered 2018-05-05: 04:00:00 via INTRAVENOUS

## 2018-05-05 MED ORDER — ROCURONIUM BROMIDE 100 MG/10ML IV SOLN
INTRAVENOUS | Status: DC | PRN
  Administered 2018-05-05 (×2): 20 mg via INTRAVENOUS

## 2018-05-05 MED ORDER — PROPOFOL 10 MG/ML IV BOLUS
INTRAVENOUS | Status: AC
  Filled 2018-05-05: qty 20

## 2018-05-05 SURGICAL SUPPLY — 49 items
APPLIER CLIP 11 MED OPEN (CLIP)
APPLIER CLIP 13 LRG OPEN (CLIP)
BARRIER ADH SEPRAFILM 3INX5IN (MISCELLANEOUS) IMPLANT
BLADE CLIPPER SURG (BLADE) IMPLANT
BLADE SURG SZ10 CARB STEEL (BLADE) IMPLANT
CANISTER SUCT 3000ML PPV (MISCELLANEOUS) ×3 IMPLANT
CHLORAPREP W/TINT 26ML (MISCELLANEOUS) ×3 IMPLANT
CLIP APPLIE 11 MED OPEN (CLIP) IMPLANT
CLIP APPLIE 13 LRG OPEN (CLIP) IMPLANT
DECANTER SPIKE VIAL GLASS SM (MISCELLANEOUS) ×6 IMPLANT
DRAPE LAPAROTOMY 100X77 ABD (DRAPES) ×3 IMPLANT
DRAPE TABLE BACK 80X90 (DRAPES) IMPLANT
DRSG OPSITE POSTOP 4X8 (GAUZE/BANDAGES/DRESSINGS) ×6 IMPLANT
DRSG TEGADERM 2-3/8X2-3/4 SM (GAUZE/BANDAGES/DRESSINGS) IMPLANT
DRSG TELFA 3X8 NADH (GAUZE/BANDAGES/DRESSINGS) IMPLANT
ELECT BLADE 6.5 EXT (BLADE) IMPLANT
ELECT CAUTERY BLADE 6.4 (BLADE) ×6 IMPLANT
ELECT REM PT RETURN 9FT ADLT (ELECTROSURGICAL) ×3
ELECTRODE REM PT RTRN 9FT ADLT (ELECTROSURGICAL) ×1 IMPLANT
GAUZE SPONGE 4X4 12PLY STRL (GAUZE/BANDAGES/DRESSINGS) IMPLANT
GLOVE BIO SURGEON STRL SZ7 (GLOVE) ×12 IMPLANT
GOWN STRL REUS W/ TWL LRG LVL3 (GOWN DISPOSABLE) ×2 IMPLANT
GOWN STRL REUS W/TWL LRG LVL3 (GOWN DISPOSABLE) ×4
HANDLE SUCTION POOLE (INSTRUMENTS) IMPLANT
HANDLE YANKAUER SUCT BULB TIP (MISCELLANEOUS) ×3 IMPLANT
LIGASURE IMPACT 36 18CM CVD LR (INSTRUMENTS) IMPLANT
NEEDLE HYPO 22GX1.5 SAFETY (NEEDLE) ×3 IMPLANT
NEEDLE HYPO 25X1 1.5 SAFETY (NEEDLE) ×3 IMPLANT
NS IRRIG 1000ML POUR BTL (IV SOLUTION) ×3 IMPLANT
NS IRRIG 500ML POUR BTL (IV SOLUTION) ×6 IMPLANT
PACK BASIN MAJOR ARMC (MISCELLANEOUS) ×3 IMPLANT
RELOAD PROXIMATE 75MM BLUE (ENDOMECHANICALS) ×9 IMPLANT
SPONGE LAP 18X18 RF (DISPOSABLE) IMPLANT
SPONGE LAP 18X36 RFD (DISPOSABLE) IMPLANT
STAPLER PROXIMATE 75MM BLUE (STAPLE) ×3 IMPLANT
STAPLER SKIN PROX 35W (STAPLE) ×3 IMPLANT
SUCTION POOLE HANDLE (INSTRUMENTS)
SUT PDS AB 0 CT1 27 (SUTURE) IMPLANT
SUT SILK 2 0 (SUTURE) ×2
SUT SILK 2 0 SH CR/8 (SUTURE) ×6 IMPLANT
SUT SILK 2 0SH CR/8 30 (SUTURE) IMPLANT
SUT SILK 2-0 18XBRD TIE 12 (SUTURE) ×1 IMPLANT
SUT VIC AB 0 CT1 36 (SUTURE) IMPLANT
SUT VIC AB 2-0 SH 27 (SUTURE) ×2
SUT VIC AB 2-0 SH 27XBRD (SUTURE) ×1 IMPLANT
SYR 30ML LL (SYRINGE) ×6 IMPLANT
SYR 3ML LL SCALE MARK (SYRINGE) IMPLANT
TAPE MICROFOAM 4IN (TAPE) IMPLANT
TRAY FOLEY MTR SLVR 16FR STAT (SET/KITS/TRAYS/PACK) IMPLANT

## 2018-05-05 NOTE — Anesthesia Postprocedure Evaluation (Signed)
Anesthesia Post Note  Patient: Angela Blair  Procedure(s) Performed: EXPLORATORY LAPAROTOMY with small bowel resection, removal of foreign body (N/A Abdomen)  Patient location during evaluation: PACU Anesthesia Type: General Level of consciousness: awake and alert Pain management: pain level controlled Vital Signs Assessment: post-procedure vital signs reviewed and stable Respiratory status: spontaneous breathing and respiratory function stable Cardiovascular status: stable Anesthetic complications: no     Last Vitals:  Vitals:   05/05/18 0328 05/05/18 0557  BP: (!) 152/57 (!) 163/69  Pulse: 80 77  Resp: (!) 21 18  Temp:  37.4 C  SpO2: 98% 99%    Last Pain:  Vitals:   05/05/18 0557  TempSrc:   PainSc: Asleep                 Taiden Raybourn K

## 2018-05-05 NOTE — Transfer of Care (Signed)
Immediate Anesthesia Transfer of Care Note  Patient: Angela Blair  Procedure(s) Performed: EXPLORATORY LAPAROTOMY with small bowel resection, removal of foreign body (N/A Abdomen)  Patient Location: PACU  Anesthesia Type:General  Level of Consciousness: awake and patient cooperative  Airway & Oxygen Therapy: Patient Spontanous Breathing and Patient connected to face mask oxygen  Post-op Assessment: Report given to RN and Post -op Vital signs reviewed and stable  Post vital signs: Reviewed and stable  Last Vitals:  Vitals Value Taken Time  BP    Temp    Pulse 78 05/05/2018  5:55 AM  Resp 19 05/05/2018  5:55 AM  SpO2 99 % 05/05/2018  5:55 AM  Vitals shown include unvalidated device data.  Last Pain:  Vitals:   05/04/18 2344  TempSrc:   PainSc: 8          Complications: No apparent anesthesia complications

## 2018-05-05 NOTE — H&P (Signed)
Patient ID: Angela Blair, female   DOB: 02-Feb-1951, 67 y.o.   MRN: [id number]  HPI Angela Blair is a 67 y.o. female acute onset of abdominal pain.  Patient reports the pain is severe, constant and sharp in nature.  Localized on the periumbilical area.  She did have vomiting and nausea.  No fevers no chills.  She was in her usual state of health until last night. They do recall having some chicken last night.  No previous episode like this. He is otherwise pretty active and is able to perform more than 4 METS of activity without any shortness of breath or chest pain.  White count was 15,000 creatinine is 1.  CT scan personally reviewed and discussed with our radiologist Dr. Chase Picket, foreign body consistent with fishbone or a chicken bone.  There is evidence of inflammation around the small bowel and around the foreign body. D/W Dr. Manson Passey in detail  HPI  Past Medical History:  Diagnosis Date  . Diabetes mellitus (HCC)    managed with diet  . Hypercholesterolemia   . Hypertension    mild, does not want to take medications    Past Surgical History:  Procedure Laterality Date  . ABDOMINAL HYSTERECTOMY    . APPENDECTOMY    . COLONOSCOPY  2013    Family History  Problem Relation Age of Onset  . Breast cancer Neg Hx     Social History Social History   Tobacco Use  . Smoking status: Never Smoker  . Smokeless tobacco: Never Used  Substance Use Topics  . Alcohol use: Not on file  . Drug use: Not on file    No Known Allergies  Current Facility-Administered Medications  Medication Dose Route Frequency Provider Last Rate Last Dose  . morphine 2 MG/ML injection 2 mg  2 mg Intravenous Once Darci Current, MD      . morphine 2 MG/ML injection 2 mg  2 mg Intravenous Once Darci Current, MD      . piperacillin-tazobactam (ZOSYN) IVPB 3.375 g  3.375 g Intravenous Once Darci Current, MD 100 mL/hr at 05/05/18 0330 3.375 g at 05/05/18 0330   Current Outpatient  Medications  Medication Sig Dispense Refill  . aspirin EC 81 MG tablet Take 81 mg by mouth daily.    . calcium-vitamin D (OSCAL WITH D) 500-200 MG-UNIT TABS tablet Take 1 tablet by mouth daily.    Marland Kitchen ezetimibe (ZETIA) 10 MG tablet Take 10 mg by mouth daily.    Marland Kitchen losartan (COZAAR) 50 MG tablet Take 50 mg by mouth daily.    Marland Kitchen lovastatin (MEVACOR) 40 MG tablet Take 40 mg by mouth daily.       Review of Systems Full ROS  was asked and was negative except for the information on the HPI  Physical Exam Blood pressure (!) 152/57, pulse 80, temperature 98.1 F (36.7 C), temperature source Oral, resp. rate (!) 21, height 5\' 2"  (1.575 m), weight 67.6 kg (149 lb), SpO2 98 %. CONSTITUTIONAL: In obvious pain EYES: Pupils are equal, round, and reactive to light, Sclera are non-icteric. EARS, NOSE, MOUTH AND THROAT: The oropharynx is clear. The oral mucosa is pink and moist. Hearing is intact to voice. LYMPH NODES:  Lymph nodes in the neck are normal. RESPIRATORY:  Lungs are clear. There is normal respiratory effort, with equal breath sounds bilaterally, and without pathologic use of accessory muscles. CARDIOVASCULAR: Heart is regular without murmurs, gallops, or rubs. GI: The abdomen is  soft,  rebound tenderness, with obvious peritoneal signs GU: Rectal deferred.   MUSCULOSKELETAL: Normal muscle strength and tone. No cyanosis or edema.   SKIN: Turgor is good and there are no pathologic skin lesions or ulcers. NEUROLOGIC: Motor and sensation is grossly normal. Cranial nerves are grossly intact. PSYCH:  Oriented to person, place and time. Affect is normal.  Data Reviewed  I have personally reviewed the patient's imaging, laboratory findings and medical records.    Assessment/Plan Acute abdomen consistent with perforated small bowel from ingested foreign body.  Looking at the images looks either like a fishbone or chicken bone.  Discussed with patient in detail and the family about my thought process.   Given her acute abdomen and peritonitis I recommend exploratory laparotomy possible bowel resection.  Discussed with the patient detail about the risk, benefits and possible complications including but not limited to: Bleeding, infection, hernias, re-interventions.  They understand and wish to proceed.  We will start fluid resuscitation with normal saline and broad-spectrum antibiotic therapy.  I have already discussed with the OR and we will go ahead and perform this emergent case   Sterling Bigiego Josy Peaden, MD FACS General Surgeon 05/05/2018, 3:38 AM

## 2018-05-05 NOTE — Anesthesia Preprocedure Evaluation (Addendum)
Anesthesia Evaluation  Patient identified by MRN, date of birth, ID band Patient awake    Reviewed: Allergy & Precautions, NPO status , Patient's Chart, lab work & pertinent test results  History of Anesthesia Complications Negative for: history of anesthetic complications  Airway Mallampati: III       Dental  (+) Partial Upper   Pulmonary neg sleep apnea, neg COPD,           Cardiovascular hypertension, Pt. on medications (-) Past MI and (-) CHF (-) dysrhythmias (-) Valvular Problems/Murmurs     Neuro/Psych neg Seizures    GI/Hepatic Neg liver ROS, neg GERD  ,  Endo/Other  diabetes (borderline), Type 2  Renal/GU negative Renal ROS     Musculoskeletal   Abdominal   Peds  Hematology   Anesthesia Other Findings   Reproductive/Obstetrics                           Anesthesia Physical Anesthesia Plan  ASA: III  Anesthesia Plan: General   Post-op Pain Management:    Induction:   PONV Risk Score and Plan: 3 and Dexamethasone, Ondansetron and Midazolam  Airway Management Planned: Oral ETT  Additional Equipment:   Intra-op Plan:   Post-operative Plan:   Informed Consent: I have reviewed the patients History and Physical, chart, labs and discussed the procedure including the risks, benefits and alternatives for the proposed anesthesia with the patient or authorized representative who has indicated his/her understanding and acceptance.     Plan Discussed with:   Anesthesia Plan Comments:         Anesthesia Quick Evaluation

## 2018-05-05 NOTE — Anesthesia Post-op Follow-up Note (Signed)
Anesthesia QCDR form completed.        

## 2018-05-05 NOTE — Anesthesia Procedure Notes (Signed)
Procedure Name: Intubation Date/Time: 05/05/2018 4:13 AM Performed by: Waldo LaineJustis, Hulda Reddix, CRNA Pre-anesthesia Checklist: Patient identified, Patient being monitored, Timeout performed, Emergency Drugs available and Suction available Patient Re-evaluated:Patient Re-evaluated prior to induction Oxygen Delivery Method: Circle system utilized Preoxygenation: Pre-oxygenation with 100% oxygen Induction Type: IV induction Ventilation: Mask ventilation without difficulty Laryngoscope Size: Miller and 2 Grade View: Grade II Tube type: Oral Tube size: 7.0 mm Number of attempts: 1 Airway Equipment and Method: Stylet Placement Confirmation: ETT inserted through vocal cords under direct vision,  positive ETCO2 and breath sounds checked- equal and bilateral Secured at: 20 cm Tube secured with: Tape Dental Injury: Teeth and Oropharynx as per pre-operative assessment

## 2018-05-05 NOTE — Op Note (Signed)
PROCEDURES: 1. Laparotomy w small bowel resection 2. Repair of incisional hernia  Pre-operative Diagnosis:Foreign body causing SB perforation  Post-operative Diagnosis: same  Surgeon: Merri Rayiego F Miriana Gaertner    Anesthesia: General endotracheal anesthesia  ASA Class: 2   Surgeon: Sterling Bigiego Wrangler Penning , MD FACS  Anesthesia: Gen. with endotracheal tube   Findings: Foreign body causing full thickness perforation, jejunal loop Widely patent anastomosis w no intra op leak incisional hernia from previous laparotomy  Estimated Blood Loss: 50cc         Drains:          Specimens: SB          Complications: none         Condition: stable  Procedure Details  The patient was seen again in the Holding Room. The benefits, complications, treatment options, and expected outcomes were discussed with the patient. The risks of bleeding, infection, recurrence of symptoms, failure to resolve symptoms,  bowel injury, any of which could require further surgery were reviewed with the patient.   The patient was taken to Operating Room, identified as Angela Blair and the procedure verified.  A Time Out was held and the above information confirmed.  Prior to the induction of general anesthesia, antibiotic prophylaxis was administered. VTE prophylaxis was in place. General endotracheal anesthesia was then administered and tolerated well. After the induction, the abdomen was prepped with Chloraprep and draped in the sterile fashion. The patient was positioned in the supine position.  Laparotomy was performed and electrocautery was used to dissect through subcutaneous tissue.  We incised the fascia on the cephalad aspect away from her previous laparotomy site.  We entered the peritoneal cavity under direct visualization after elevating the fascia and incising it.  We did encounter some adhesions of omentum was stuck to the small bowel from a previous laparotomy site and were able to take it down with  electrocautery. She was turned to the small bowel where we finally a 40 body perfusion and creating a perforation of the small bowel on the jejunal side.  The perforation was a couple millimeters in diameter.  There was no gross purulence or feculent material. A small bowel resection was performed given that there was significant inflammatory response about the area of perforation.  We created a window on the proximal and distal aspect of the perforation within the mesentery of the small bowel.  Using a standard 75 GIA we divided the bowel and perform an side-to-side functional end-to-end anastomosis with a 75 GIA staple.  The mesenteric defect was closed.  We tested the anastomosis there was no evidence of any intraoperative leak.  There was good perfusion to the anastomosis and no tension.  The bowel was run without evidence of any intra-abdominal pathology. Abdomen was irrigated with normal saline. Good hemostasis was observed no evidence of any iatrogenic injuries were encountered.   We changed gloves and place a new tray to close the abdomen with a -0 PDS suture in a running fashion  incorporating the incisional defect , the skin was closed with staples. Liposomal Marcaine was injected on all incision sites under direct visualization. Needle and laparotomy count were correct and there were no immediate complications.  Sterling Bigiego Dariush Mcnellis, MD, FACS

## 2018-05-06 ENCOUNTER — Encounter: Payer: Self-pay | Admitting: Surgery

## 2018-05-06 LAB — CBC
HCT: 35.7 % (ref 35.0–47.0)
Hemoglobin: 12 g/dL (ref 12.0–16.0)
MCH: 29 pg (ref 26.0–34.0)
MCHC: 33.7 g/dL (ref 32.0–36.0)
MCV: 86.1 fL (ref 80.0–100.0)
Platelets: 212 10*3/uL (ref 150–440)
RBC: 4.15 MIL/uL (ref 3.80–5.20)
RDW: 14.6 % — ABNORMAL HIGH (ref 11.5–14.5)
WBC: 10.6 10*3/uL (ref 3.6–11.0)

## 2018-05-06 LAB — SURGICAL PATHOLOGY

## 2018-05-06 LAB — MAGNESIUM: Magnesium: 2 mg/dL (ref 1.7–2.4)

## 2018-05-06 LAB — BASIC METABOLIC PANEL
Anion gap: 4 — ABNORMAL LOW (ref 5–15)
BUN: 12 mg/dL (ref 8–23)
CALCIUM: 8.6 mg/dL — AB (ref 8.9–10.3)
CO2: 28 mmol/L (ref 22–32)
Chloride: 106 mmol/L (ref 98–111)
Creatinine, Ser: 0.71 mg/dL (ref 0.44–1.00)
GFR calc Af Amer: >60 mL/min (ref >60)
GLUCOSE: 103 mg/dL — AB (ref 70–99)
Potassium: 4 mmol/L (ref 3.5–5.1)
Sodium: 138 mmol/L (ref 135–145)

## 2018-05-06 LAB — HIV ANTIBODY (ROUTINE TESTING W REFLEX): HIV SCREEN 4TH GENERATION: NONREACTIVE

## 2018-05-06 MED ORDER — LACTATED RINGERS IV SOLN
INTRAVENOUS | Status: DC
  Administered 2018-05-06: 11:00:00 via INTRAVENOUS

## 2018-05-06 NOTE — Progress Notes (Signed)
POD # 1 Had some nausea nad vomiting last night  Feeling much better this am AVSS WBC and creat ok Good UO  PE NAD Abd: soft, appropriate tenderness, no infection, dressing intact  A/P Doing well She is hungry and wishes to have her diet advance, I am hesitant but she wishes to try, may need to place her back on clears if ileus does not resolve.

## 2018-05-07 LAB — BASIC METABOLIC PANEL
Anion gap: 6 (ref 5–15)
BUN: 11 mg/dL (ref 8–23)
CHLORIDE: 103 mmol/L (ref 98–111)
CO2: 28 mmol/L (ref 22–32)
CREATININE: 0.6 mg/dL (ref 0.44–1.00)
Calcium: 8.4 mg/dL — ABNORMAL LOW (ref 8.9–10.3)
GFR calc Af Amer: >60 mL/min (ref >60)
GFR calc non Af Amer: >60 mL/min (ref >60)
GLUCOSE: 129 mg/dL — AB (ref 70–99)
Potassium: 3.9 mmol/L (ref 3.5–5.1)
Sodium: 137 mmol/L (ref 135–145)

## 2018-05-07 MED ORDER — KCL IN DEXTROSE-NACL 20-5-0.45 MEQ/L-%-% IV SOLN
INTRAVENOUS | Status: DC
  Administered 2018-05-07 – 2018-05-10 (×5): via INTRAVENOUS
  Filled 2018-05-07 (×7): qty 1000

## 2018-05-07 MED ORDER — PROMETHAZINE HCL 25 MG/ML IJ SOLN
12.5000 mg | Freq: Once | INTRAMUSCULAR | Status: AC
  Administered 2018-05-07: 12.5 mg via INTRAVENOUS
  Filled 2018-05-07: qty 1

## 2018-05-07 NOTE — Progress Notes (Signed)
SURGICAL PROGRESS NOTE  Hospital Day(s): 2.   Post op day(s): 2 Days Post-Op.   Interval History: Patient seen and examined, no acute events or new complaints overnight. Patient reports minimal well-controlled peri-incisional abdominal pain with starting to pass +flatus, though no BM yet. She otherwise describes tolerating clear liquids diet without N/V, fever/chills, CP, or SOB. She says she has not yet ambulated much in the halls.  Review of Systems:  Constitutional: denies fever, chills  Respiratory: denies any shortness of breath  Cardiovascular: denies chest pain or palpitations  Gastrointestinal: abdominal pain, N/V, and bowel function as per interval history Musculoskeletal: denies pain, decreased motor or sensation Integumentary: denies any other rashes or skin discolorations except post-surgical abdominal wounds  Vital signs in last 24 hours: [min-max] current  Temp:  [98.3 F (36.8 C)-99 F (37.2 C)] 98.3 F (36.8 C) (07/04 0257) Pulse Rate:  [58-75] 58 (07/04 0257) Resp:  [16-20] 16 (07/04 0257) BP: (109-137)/(60-64) 137/60 (07/04 0257) SpO2:  [91 %-93 %] 91 % (07/04 0257)     Height: 5\' 2"  (157.5 cm) Weight: 149 lb (67.6 kg) BMI (Calculated): 27.25   Intake/Output this shift:  Total I/O In: 240 [P.O.:240] Out: -    Intake/Output last 2 shifts:  @IOLAST2SHIFTS @   Physical Exam:  Constitutional: alert, cooperative and no distress  Respiratory: breathing non-labored at rest  Cardiovascular: regular rate and sinus rhythm  Gastrointestinal: soft and non-distended with minimal peri-incisional tenderness to palpation with incision well-approximated without any surrounding erythema or drainage  Labs:  CBC Latest Ref Rng & Units 05/06/2018 05/05/2018 05/04/2018  WBC 3.6 - 11.0 K/uL 10.6 11.4(H) 15.6(H)  Hemoglobin 12.0 - 16.0 g/dL 16.112.0 09.613.6 04.515.3  Hematocrit 35.0 - 47.0 % 35.7 41.3 45.3  Platelets 150 - 440 K/uL 212 258 287   CMP Latest Ref Rng & Units 05/07/2018 05/06/2018  05/05/2018  Glucose 70 - 99 mg/dL 409(W129(H) 119(J103(H) -  BUN 8 - 23 mg/dL 11 12 -  Creatinine 4.780.44 - 1.00 mg/dL 2.950.60 6.210.71 3.080.56  Sodium 135 - 145 mmol/L 137 138 -  Potassium 3.5 - 5.1 mmol/L 3.9 4.0 -  Chloride 98 - 111 mmol/L 103 106 -  CO2 22 - 32 mmol/L 28 28 -  Calcium 8.9 - 10.3 mg/dL 6.5(H8.4(L) 8.4(O8.6(L) -  Total Protein 6.5 - 8.1 g/dL - - -  Total Bilirubin 0.3 - 1.2 mg/dL - - -  Alkaline Phos 38 - 126 U/L - - -  AST 15 - 41 U/L - - -  ALT 0 - 44 U/L - - -   Imaging studies: No new pertinent imaging studies   Assessment/Plan: 67 y.o. female with gradually resolving post-surgical ileus 2 Days Post-Op s/p laparotomy with small bowel resection and repair of incisional hernia for small bowel perforation attributed to an ingested chicken bone, complicated by pertinent comorbidities including DM, HTN, and hypercholesterolemia.   - advanced to full liquids diet   - pain control as needed (minimize narcotics)   - monitor abdominal exam and bowel function  - ambulation encouraged, DVT prophylaxis  - discharge planning  All of the above findings and recommendations were discussed with the patient and patient's family, and all of patient's and family's questions were answered to their expressed satisfaction.  -- Scherrie GerlachJason E. Earlene Plateravis, MD, RPVI [place]: [facility] General Surgery - Partnering for exceptional care. Office: [phone]

## 2018-05-08 NOTE — Care Management Important Message (Signed)
Copy of signed IM left with patient in room.  

## 2018-05-08 NOTE — Progress Notes (Signed)
Patient had 500ml of green emesis.  I spoke with Dr Earlene Plateravis.  We are going to watch the patient at this time.  If patient continues to have vomiting then an NG tube could be considered.  I informed the Dr that the patient would not take zofran because she says it made her "loopy" yesterday.  I was able to see that the patient received oxycodone yesterday about 1 hr and 15 minutes before receiving the zofran and the oxycodone could have been the cause of the patient feeling "loopy". I relayed this to the patient but she was still unwilling to try the zofran.  She also expressed that she did not want an NG tube

## 2018-05-08 NOTE — Progress Notes (Addendum)
SURGICAL PROGRESS NOTE  Hospital Day(s): 3.   Post op day(s): 3 Days Post-Op.   Interval History: Patient seen and examined. Patient reports multiple loose BM's yesterday with nausea and at least one episode of nausea with non-bloody emesis, while nursing reported an episode of nausea without emesis. Regardless, patient now states BM's have slowed and become more formed with ongoing +flatus and currently no nausea. Patient otherwise reports only mild/minimal peri-incisional pain, denies fever/chills, CP, or SOB. She also says she ambulated once yesterday and requests to rest this morning, says she may want to walk later today.  Review of Systems:  Constitutional: denies fever, chills  Respiratory: denies any shortness of breath  Cardiovascular: denies chest pain or palpitations  Gastrointestinal: abdominal pain, N/V, and bowel function as per interval history Musculoskeletal: denies pain, decreased motor or sensation Integumentary: denies any other rashes or skin discolorations except post-surgical abdominal wounds  Vital signs in last 24 hours: [min-max] current  Temp:  [98.2 F (36.8 C)-98.5 F (36.9 C)] 98.5 F (36.9 C) (07/05 0532) Pulse Rate:  [65-70] 70 (07/05 0532) Resp:  [16-20] 18 (07/05 0532) BP: (150-165)/(69-71) 150/70 (07/05 0532) SpO2:  [91 %-97 %] 91 % (07/05 0532)     Height: 5\' 2"  (157.5 cm) Weight: 149 lb (67.6 kg) BMI (Calculated): 27.25   Intake/Output this shift:  Total I/O In: 759.2 [I.V.:759.2] Out: -    Intake/Output last 2 shifts:  @IOLAST2SHIFTS @   Physical Exam:  Constitutional: alert, cooperative and no distress  Respiratory: breathing non-labored at rest  Cardiovascular: regular rate and sinus rhythm  Gastrointestinal: soft and non-distended with mild peri-incisional tenderness to palpation, no surrounding erythema or drainage with dressing c/d/i except a small focus of dried blood at center of dressing unchanged since yesterday  Labs:  CBC  Latest Ref Rng & Units 05/06/2018 05/05/2018 05/04/2018  WBC 3.6 - 11.0 K/uL 10.6 11.4(H) 15.6(H)  Hemoglobin 12.0 - 16.0 g/dL 95.612.0 21.313.6 08.615.3  Hematocrit 35.0 - 47.0 % 35.7 41.3 45.3  Platelets 150 - 440 K/uL 212 258 287   CMP Latest Ref Rng & Units 05/07/2018 05/06/2018 05/05/2018  Glucose 70 - 99 mg/dL 578(I129(H) 696(E103(H) -  BUN 8 - 23 mg/dL 11 12 -  Creatinine 9.520.44 - 1.00 mg/dL 8.410.60 3.240.71 4.010.56  Sodium 135 - 145 mmol/L 137 138 -  Potassium 3.5 - 5.1 mmol/L 3.9 4.0 -  Chloride 98 - 111 mmol/L 103 106 -  CO2 22 - 32 mmol/L 28 28 -  Calcium 8.9 - 10.3 mg/dL 0.2(V8.4(L) 2.5(D8.6(L) -  Total Protein 6.5 - 8.1 g/dL - - -  Total Bilirubin 0.3 - 1.2 mg/dL - - -  Alkaline Phos 38 - 126 U/L - - -  AST 15 - 41 U/L - - -  ALT 0 - 44 U/L - - -   Imaging studies: No new pertinent imaging studies   Assessment/Plan: 67 y.o. female with gradually resolving post-surgical ileus 3 Days Post-Op s/p laparotomy with small bowel resection and repair of incisional hernia for small bowel perforation attributed to an ingested chicken bone, complicated by pertinent comorbidities including DM, HTN, and hypercholesterolemia.              - will continue with full liquids diet for now              - pain control as needed (minimize narcotics)  - if persistent watery or explosive diarrhea, check c diff             -  if/when feels better, may consider advancing to soft diet             - monitor abdominal exam and bowel function             - ambulation encouraged, DVT prophylaxis  All of the above findings and recommendations were discussed with the patient and patient's family, and all of patient's and family's questions were answered to their expressed satisfaction.  -- Scherrie Gerlach Earlene Plater, MD, RPVI [place]: [facility] General Surgery - Partnering for exceptional care. Office: [phone]

## 2018-05-08 NOTE — Progress Notes (Signed)
Patient requested to have some Imodium for complaints of loose stools. Called and spoke with Dr. Earlene Plateravis via phone about patient request. Dr.Davis said no to Imodium due to loose stool is common for post op bowel surgery. Verbal order given that we may check for C-diff if loose stools continue during the night. Anselm Junglingonyers,Kyan Yurkovich M

## 2018-05-09 LAB — C DIFFICILE QUICK SCREEN W PCR REFLEX
C DIFFICILE (CDIFF) TOXIN: NEGATIVE
C DIFFICLE (CDIFF) ANTIGEN: NEGATIVE
C Diff interpretation: NOT DETECTED

## 2018-05-09 MED ORDER — TRAMADOL HCL 50 MG PO TABS
50.0000 mg | ORAL_TABLET | Freq: Four times a day (QID) | ORAL | Status: DC
  Administered 2018-05-09 (×2): 50 mg via ORAL
  Filled 2018-05-09 (×2): qty 1

## 2018-05-09 MED ORDER — PROMETHAZINE HCL 25 MG/ML IJ SOLN
12.5000 mg | Freq: Once | INTRAMUSCULAR | Status: AC
  Administered 2018-05-09: 12.5 mg via INTRAVENOUS
  Filled 2018-05-09: qty 1

## 2018-05-09 MED ORDER — TRAMADOL HCL 50 MG PO TABS: 50.0000 mg | ORAL_TABLET | Freq: Four times a day (QID) | ORAL | Status: DC | PRN

## 2018-05-09 NOTE — Progress Notes (Signed)
SURGICAL PROGRESS NOTE  Hospital Day(s): 4.   Post op day(s): 4 Days Post-Op.   Interval History: Patient seen and examined, continuing to +flatus and tolerate small amounts of full liquids diet with decreasing more formed loose BM's, for which patient overnight requested Imodium despite prior discussions regarding this being an entirely anticipated part of recovering bowel function. Patient describes her peri-incisional pain is now mild and has improved each day, as has her post-surgical nausea and an episode of emesis yesterday and 3 episodes of emesis the day before (not reported until yesterday). Patient is also now ambulating in the halls multiple times per day without difficulty, fever/chills, CP, or SOB.  Review of Systems:  Constitutional: denies fever, chills  Respiratory: denies any shortness of breath  Cardiovascular: denies chest pain or palpitations  Gastrointestinal: abdominal pain, N/V, and bowel function as per interval history Musculoskeletal: denies pain, decreased motor or sensation Integumentary: denies any other rashes or skin discolorations except post-surgical abdominal wounds  Vital signs in last 24 hours: [min-max] current  Temp:  [98.2 F (36.8 C)-100.4 F (38 C)] 98.6 F (37 C) (07/06 0540) Pulse Rate:  [69-71] 69 (07/06 0540) Resp:  [16] 16 (07/06 0540) BP: (144-168)/(69-78) 164/78 (07/06 0540) SpO2:  [93 %-96 %] 96 % (07/06 0540)     Height: 5\' 2"  (157.5 cm) Weight: 149 lb (67.6 kg) BMI (Calculated): 27.25   Intake/Output this shift:  Total I/O In: -  Out: 200 [Urine:150; Stool:50]   Intake/Output last 2 shifts:  @IOLAST2SHIFTS @   Physical Exam:  Constitutional: alert, cooperative and no distress  Respiratory: breathing non-labored at rest  Cardiovascular: regular rate and sinus rhythm  Gastrointestinal: soft and non-distended with mild decreased peri-incisional tenderness to palpation, incision well-approximated without any surrounding erythema  or drainage  Labs:  CBC Latest Ref Rng & Units 05/06/2018 05/05/2018 05/04/2018  WBC 3.6 - 11.0 K/uL 10.6 11.4(H) 15.6(H)  Hemoglobin 12.0 - 16.0 g/dL 78.2 95.6 21.3  Hematocrit 35.0 - 47.0 % 35.7 41.3 45.3  Platelets 150 - 440 K/uL 212 258 287   CMP Latest Ref Rng & Units 05/07/2018 05/06/2018 05/05/2018  Glucose 70 - 99 mg/dL 086(V) 784(O) -  BUN 8 - 23 mg/dL 11 12 -  Creatinine 9.62 - 1.00 mg/dL 9.52 8.41 3.24  Sodium 135 - 145 mmol/L 137 138 -  Potassium 3.5 - 5.1 mmol/L 3.9 4.0 -  Chloride 98 - 111 mmol/L 103 106 -  CO2 22 - 32 mmol/L 28 28 -  Calcium 8.9 - 10.3 mg/dL 4.0(N) 0.2(V) -  Total Protein 6.5 - 8.1 g/dL - - -  Total Bilirubin 0.3 - 1.2 mg/dL - - -  Alkaline Phos 38 - 126 U/L - - -  AST 15 - 41 U/L - - -  ALT 0 - 44 U/L - - -   Imaging studies: No new pertinent imaging studies   Assessment/Plan: 67 y.o.femalewith resolving post-surgical ileus, decreasing loose BM's in this context (to be expected), and improved both nausea and peri-incisional pain 4 Days Post-Ops/p laparotomy with small bowel resection and repair of incisional herniafor small bowel perforation attributed toan ingested chicken bone, complicated by pertinent comorbidities includingDM, HTN, and hypercholesterolemia.  - advanced to soft diet  - discussed with patient test negative for c diff - pain control as needed, though minimize narcotics  - advised patient to allow her body to guide her appetite - continue to monitor abdominal exam and bowel function  - anticipate discharge planning for tomorrow vs Monday -  ambulation encouraged, DVT prophylaxis  All of the above findings and recommendations were discussed with the patientandpatient's family, and all of patient's and family's questions were answered to their expressed satisfaction.  -- Scherrie GerlachJason E. Earlene Plateravis, MD, RPVI [place]: [facility] General Surgery - Partnering for exceptional  care. Office: [phone]

## 2018-05-09 NOTE — Progress Notes (Signed)
Patient assisted to bathroom, patient had small amount of liquid/jelly like stool. Patient continues to have intermittent bouts of nausea. Zofran offered as needed.  Anselm Junglingonyers,Vaishnavi Dalby M

## 2018-05-10 LAB — GLUCOSE, CAPILLARY: GLUCOSE-CAPILLARY: 94 mg/dL (ref 70–99)

## 2018-05-10 MED ORDER — ENSURE ENLIVE PO LIQD: 237.0000 mL | Freq: Two times a day (BID) | ORAL | Status: DC

## 2018-05-10 MED ORDER — IBUPROFEN 400 MG PO TABS: 600.0000 mg | ORAL_TABLET | Freq: Four times a day (QID) | ORAL | Status: DC | PRN

## 2018-05-10 NOTE — Progress Notes (Signed)
Called to pt room by offgoing RN Angela Blair. Pt ambulated to BR and felt weak, dizzy and "like I was going to pass out". Pt assisted back to Angela and FSBS checked and noted to be 94. Orthostatic vital signs checked and reported to Dr Earlene Plateravis MD. BP running in 160's systolic. Pt denies needs at this time.

## 2018-05-10 NOTE — Progress Notes (Signed)
Pt able to tolerate PO intake today. Pt able to eat crackers, potatoes and a sandwich during the day. Pt states she had nausea with strong smells. Pt has tolerated PO medication today when taken with crackers. Pt encouraged to increase PO intake including fluids. Pt reports fear of nausea. Emotional support provided to pt. Family at bedside.

## 2018-05-10 NOTE — Progress Notes (Signed)
Called to pt's room due to bed alarm going off. Found pt in bathroom, family at bedside. Educated pt and family regarding safety measures, bed alarms and the need for staff to be present to keep pt safe. Pt requested bed alarms be turned off. Family at bedside agrees. Pt verbalizes understanding of refusal and states she will call for staff if she need assistance.

## 2018-05-10 NOTE — Progress Notes (Signed)
SURGICAL PROGRESS NOTE  Hospital Day(s): 5.   Post op day(s): 5 Days Post-Op.   Interval History: Patient seen and examined, experienced one episode of emesis after being given Tramadol yesterday evening. Otherwise, patient reports fear of nausea with eating without any further episodes of N/V and more formed decreased volume/number of BM's with ongoing +flatus with minimal peri-incisional abdominal pain and denies fever/chills, CP, or SOB. Patient has also been ambulating in halls.  Review of Systems:  Constitutional: denies fever, chills  Respiratory: denies any shortness of breath  Cardiovascular: denies chest pain or palpitations  Gastrointestinal: abdominal pain, N/V, and bowel function as per interval history Musculoskeletal: denies pain, decreased motor or sensation Integumentary: denies any other rashes or skin discolorations except post-surgical abdominal wounds  Vital signs in last 24 hours: [min-max] current  Temp:  [98.3 F (36.8 C)-98.7 F (37.1 C)] 98.7 F (37.1 C) (07/07 0521) Pulse Rate:  [59-72] 59 (07/07 0521) Resp:  [16] 16 (07/07 0521) BP: (142-162)/(71-92) 144/80 (07/07 0521) SpO2:  [96 %-98 %] 96 % (07/07 0521)     Height: 5\' 2"  (157.5 cm) Weight: 149 lb (67.6 kg) BMI (Calculated): 27.25   Intake/Output this shift:  No intake/output data recorded.   Intake/Output last 2 shifts:  @IOLAST2SHIFTS @   Physical Exam:  Constitutional: alert, cooperative and no distress  Respiratory: breathing non-labored at rest  Cardiovascular: regular rate and sinus rhythm  Gastrointestinal: soft, non-tender, and non-distended with incision well-approximated without any surrounding peri-incisional erythema or drainage  Labs:  CBC Latest Ref Rng & Units 05/06/2018 05/05/2018 05/04/2018  WBC 3.6 - 11.0 K/uL 10.6 11.4(H) 15.6(H)  Hemoglobin 12.0 - 16.0 g/dL 16.112.0 09.613.6 04.515.3  Hematocrit 35.0 - 47.0 % 35.7 41.3 45.3  Platelets 150 - 440 K/uL 212 258 287   CMP Latest Ref Rng &  Units 05/07/2018 05/06/2018 05/05/2018  Glucose 70 - 99 mg/dL 409(W129(H) 119(J103(H) -  BUN 8 - 23 mg/dL 11 12 -  Creatinine 4.780.44 - 1.00 mg/dL 2.950.60 6.210.71 3.080.56  Sodium 135 - 145 mmol/L 137 138 -  Potassium 3.5 - 5.1 mmol/L 3.9 4.0 -  Chloride 98 - 111 mmol/L 103 106 -  CO2 22 - 32 mmol/L 28 28 -  Calcium 8.9 - 10.3 mg/dL 6.5(H8.4(L) 8.4(O8.6(L) -  Total Protein 6.5 - 8.1 g/dL - - -  Total Bilirubin 0.3 - 1.2 mg/dL - - -  Alkaline Phos 38 - 126 U/L - - -  AST 15 - 41 U/L - - -  ALT 0 - 44 U/L - - -   Imaging studies: No new pertinent imaging studies   Assessment/Plan: 67 y.o.femalewith resolving post-surgical ileus, decreasing loose BM's in this context (to be expected), and improved both nausea and peri-incisional pain 5Days Post-Ops/p laparotomy with small bowel resection and repair of incisional herniafor small bowel perforation attributed toan ingested chicken bone, complicated by pertinent comorbidities includingDM, HTN, and hypercholesterolemia.  -regular diet with supplements encouraged - pain control as needed, discontinued narcotics - continue to monitor abdominal exam and bowel function             - anticipate discharge planning for likely tomorrow - ambulation encouraged, DVT prophylaxis  All of the above findings and recommendations were discussed with the patientandpatient's family, and all of patient's and family's questions were answered to their expressed satisfaction.  -- Scherrie GerlachJason E. Earlene Plateravis, MD, RPVI [place]: [facility] General Surgery - Partnering for exceptional care. Office: [phone]

## 2018-05-11 NOTE — Progress Notes (Signed)
Patient asked if she could shower this morning. Patient was assisted with a shower at 0645. Patient tolerated well. Bed linens were changed. Patient was very appreciative and said it made her feel better. Anselm Junglingonyers,Abrahm Mancia M

## 2018-05-11 NOTE — Progress Notes (Signed)
Rounded on patient, she was lying in bed watching television. Patient denied any nausea or diarrhea at this time. Anselm Junglingonyers,Ordean Fouts M

## 2018-05-11 NOTE — Progress Notes (Signed)
Patient was sitting on side of bed with trash can in front of her. Patient reported that she had just finished using the bathroom when the smell made her sick on her stomach. Nausea medication was offered but patient stated she wanted to wait and see if the feeling would subside. Patient encouraged to call if she changes her mind. Patient denies any needs at this time. Angela Blair,Angela Blair

## 2018-05-11 NOTE — Progress Notes (Signed)
POD 6 Feeling better Has had diarrhea, C diff is negative Taking PO, some nausea likely related to narcotics, now is better. AVSS Ambulating in the halls, had shower this am Path noted and d/w pt  PE NAD Abd: soft, incision c/d/i, staples in place, no infection or peritonitis  A/p Doing ok, MAR reviewed, we will DC neurontin, only tylenol and NSAIDS for pain Recheck lytes DC 24-48 hrs depending on clinical condiition

## 2018-05-11 NOTE — Care Management Important Message (Signed)
Copy of signed IM left with patient in room.  

## 2018-05-12 ENCOUNTER — Inpatient Hospital Stay: Payer: PPO

## 2018-05-12 LAB — CBC
HCT: 37.9 % (ref 35.0–47.0)
Hemoglobin: 12.8 g/dL (ref 12.0–16.0)
MCH: 28.3 pg (ref 26.0–34.0)
MCHC: 33.8 g/dL (ref 32.0–36.0)
MCV: 83.5 fL (ref 80.0–100.0)
PLATELETS: 375 10*3/uL (ref 150–440)
RBC: 4.54 MIL/uL (ref 3.80–5.20)
RDW: 14.1 % (ref 11.5–14.5)
WBC: 8.1 10*3/uL (ref 3.6–11.0)

## 2018-05-12 LAB — BASIC METABOLIC PANEL
Anion gap: 8 (ref 5–15)
BUN: 14 mg/dL (ref 8–23)
CHLORIDE: 105 mmol/L (ref 98–111)
CO2: 28 mmol/L (ref 22–32)
CREATININE: 0.66 mg/dL (ref 0.44–1.00)
Calcium: 8.9 mg/dL (ref 8.9–10.3)
GFR calc non Af Amer: >60 mL/min (ref >60)
Glucose, Bld: 95 mg/dL (ref 70–99)
Potassium: 4.2 mmol/L (ref 3.5–5.1)
SODIUM: 141 mmol/L (ref 135–145)

## 2018-05-12 MED ORDER — METOCLOPRAMIDE HCL 5 MG/ML IJ SOLN
10.0000 mg | Freq: Three times a day (TID) | INTRAMUSCULAR | Status: DC
  Administered 2018-05-12 – 2018-05-13 (×2): 10 mg via INTRAVENOUS
  Filled 2018-05-12 (×2): qty 2

## 2018-05-12 MED ORDER — METOCLOPRAMIDE HCL 5 MG/ML IJ SOLN
10.0000 mg | Freq: Three times a day (TID) | INTRAMUSCULAR | Status: DC
  Administered 2018-05-12: 10 mg via INTRAVENOUS
  Filled 2018-05-12: qty 2

## 2018-05-12 MED ORDER — LACTATED RINGERS IV SOLN
INTRAVENOUS | Status: DC
  Administered 2018-05-12: 14:00:00 via INTRAVENOUS

## 2018-05-12 NOTE — Progress Notes (Signed)
POD 7 Feeling better Diarrhea improving but sis have nausea and one episode of emesis early this am Labs ok  AVSS Ambulating in the halls, had shower this am   PE NAD Abd: soft, incision c/d/i, staples in place, no infection or peritonitis  A/p Doing ok, Check abd xray r/o ileus Not ready to be DC yet May do some reglan as well

## 2018-05-12 NOTE — Progress Notes (Signed)
Rounded on patient , she is lying on right side with eyes closed. Resting quietly. Anselm Junglingonyers,Stacye Noori M

## 2018-05-13 LAB — BASIC METABOLIC PANEL
ANION GAP: 5 (ref 5–15)
BUN: 13 mg/dL (ref 8–23)
CO2: 28 mmol/L (ref 22–32)
CREATININE: 0.61 mg/dL (ref 0.44–1.00)
Calcium: 8.5 mg/dL — ABNORMAL LOW (ref 8.9–10.3)
Chloride: 108 mmol/L (ref 98–111)
GFR calc non Af Amer: >60 mL/min (ref >60)
Glucose, Bld: 110 mg/dL — ABNORMAL HIGH (ref 70–99)
Potassium: 3.8 mmol/L (ref 3.5–5.1)
Sodium: 141 mmol/L (ref 135–145)

## 2018-05-13 NOTE — Discharge Summary (Signed)
  Patient ID: Bonney Aidvdoxia L Claw MRN: [id number] DOB/AGE: 1950/12/10 67 y.o.  Admit date: 05/04/2018 Discharge date: 05/13/2018   Discharge Diagnoses:  Active Problems:   Bowel perforation [facility](HCC)   Procedures:laparotomy w bowel resection  Hospital Course: 67 year old female presented to the emergency room with an acute abdomen from a bowel perforation.  She was taken emergently to the operating room for laparotomy and bowel resection secondary to perforation from a foreign body.  She did well postoperatively but developed an ileus that is slow down her recovery.  Did not require an NG tube but clear liquid diet.  Also she did have some side effects related to the narcotics as well.  Her medication was trimmed down and all the narcotics were removed.  The patient ileus improved and she was advanced to a regular diet which she tolerated well.  At the time of discharge she was ambulating, tolerating regular diet and her pain was controlled.  Her physical exam showed a female in no acute distress.  Awake and alert.  Abdomen: Soft, appropriate mild incisional tenderness.  Staples in place that will be removed by the nurse.  No evidence of infection.  Extremities: No edema well-perfused.  Condition of the patient at the time of discharge was stable   Disposition: Discharge disposition: 01-Home or Self Care       Discharge Instructions    Call MD for:  difficulty breathing, headache or visual disturbances   Complete by:  As directed    Call MD for:  extreme fatigue   Complete by:  As directed    Call MD for:  hives   Complete by:  As directed    Call MD for:  persistant dizziness or light-headedness   Complete by:  As directed    Call MD for:  persistant nausea and vomiting   Complete by:  As directed    Call MD for:  redness, tenderness, or signs of infection (pain, swelling, redness, odor or green/yellow discharge around incision site)   Complete by:  As directed    Call MD for:  severe  uncontrolled pain   Complete by:  As directed    Call MD for:  temperature >100.4   Complete by:  As directed    Diet - low sodium heart healthy   Complete by:  As directed    Discharge instructions   Complete by:  As directed    Shower daily   Increase activity slowly   Complete by:  As directed      Allergies as of 05/13/2018   No Known Allergies     Medication List    TAKE these medications   aspirin EC 81 MG tablet Take 81 mg by mouth daily.   calcium-vitamin D 500-200 MG-UNIT Tabs tablet Commonly known as:  OSCAL WITH D Take 1 tablet by mouth daily.   ezetimibe 10 MG tablet Commonly known as:  ZETIA Take 10 mg by mouth daily.   losartan 50 MG tablet Commonly known as:  COZAAR Take 50 mg by mouth daily.   lovastatin 40 MG tablet Commonly known as:  MEVACOR Take 40 mg by mouth daily.      Follow-up Information    Leafy RoPabon, Richar Dunklee F, MD. Go on 05/20/2018.   Specialty:  General Surgery Why:  @1 :45p Contact information: [address] [place] [id number] [phone]            Sterling Bigiego Jakeia Carreras, MD FACS

## 2018-05-13 NOTE — Care Management Important Message (Signed)
Copy of signed IM left with patient in room.  

## 2018-05-13 NOTE — Progress Notes (Signed)
Patient's staples removed without complication, skin edges attached, with no discharge. Discharge teaching given to patient, patient verbalized understanding and had no questions. Patient IV removed. Patient will be transported home by family. All patient belongings gathered prior to leaving.

## 2018-05-13 NOTE — Discharge Instructions (Signed)
Open Small Bowel Resection, Care After °This sheet gives you information about how to care for yourself after your procedure. Your health care provider may also give you more specific instructions. If you have problems or questions, contact your health care provider. °What can I expect after the procedure? °After the procedure, it is common to have: °· Pain in your abdomen, especially along your incision. You will be given pain medicines to control this. °· Tiredness. This is a normal part of the recovery process. Your energy level will return to normal over the next several weeks. °· Constipation. You may be given a stool softener to help prevent this. ° °Follow these instructions at home: °Medicines °· Take over-the-counter and prescription medicines only as told by your health care provider. °· Do not drive or use heavy machinery while taking prescription pain medicine. °· If you were prescribed an antibiotic medicine, use it as told by your health care provider. Do not stop using the antibiotic even if you start to feel better. °Activity °· Return to your normal activities as told by your health care provider. Ask your health care provider what activities are safe for you. °· Do not lift anything that is heavier than 10 lb (4.5 kg) until your health care provider says that it is safe. °· Take frequent rest breaks during the day as needed. °· Try to take short walks every day for the amount of time that your health care provider suggests. °· Avoid activities that require a lot of effort (are strenuous) for as long as told by your health care provider. °Incision care °· Follow instructions from your health care provider about how to take care of your incision. Make sure you: °? Wash your hands with soap and water before you change your bandage (dressing). If soap and water are not available, use hand sanitizer. °? Change your dressing as told by your health care provider. °? Leave stitches (sutures), skin glue, or  adhesive strips in place. These skin closures may need to stay in place for 2 weeks or longer. If adhesive strip edges start to loosen and curl up, you may trim the loose edges. Do not remove adhesive strips completely unless your health care provider tells you to do that. °· Check your incision area every day for signs of infection. Check for: °? More redness, swelling, or pain. °? More fluid or blood. °? Warmth. °? Pus or a bad smell. °· Do not take baths, swim, or use a hot tub until your health care provider approves. Ask your health care provider if you can take showers. You may only be allowed to take sponge baths for bathing. °General instructions °· Continue to practice deep breathing and coughing. If it hurts to cough, try holding a pillow against your abdomen as you cough. °· To prevent or treat constipation while you are taking prescription pain medicine, your health care provider may recommend that you: °? Drink enough fluid to keep your urine clear or pale yellow. °? Take over-the-counter or prescription medicines. °? Eat foods that are high in fiber, such as fresh fruits and vegetables, whole grains, and beans. °? Limit foods that are high in fat and processed sugars, such as fried and sweet foods. °· Do not use any products that contain nicotine or tobacco as told by your health care provider. These include cigarettes and e-cigarettes. If you need help quitting, ask your health care provider. °· Keep all follow-up visits as told by your health care   provider. This is important. °Contact a health care provider if: °· You have pain that is not relieved with medicine. °· You do not feel like eating. °· You feel nauseous or you vomit. °· You have constipation that is not relieved with prescribed stool softeners. °· You have more redness, swelling, or pain around your incision. °· You have more fluid or blood coming from your incision. °· Your incision feels warm to the touch. °· You have pus or a bad smell  coming from your incision. °· You have a fever. °Get help right away if: °· Your pain gets worse, even after you take pain medicine. °· Your legs or arms hurt or become red or swollen. °· You have chest pain. °· You have trouble breathing. °This information is not intended to replace advice given to you by your health care provider. Make sure you discuss any questions you have with your health care provider. °Document Released: 03/25/2011 Document Revised: 07/30/2016 Document Reviewed: 07/22/2016 °Elsevier Interactive Patient Education © 2018 [employer] ° °

## 2018-05-19 ENCOUNTER — Telehealth: Payer: Self-pay | Admitting: Licensed Clinical Social Worker

## 2018-05-19 NOTE — Telephone Encounter (Signed)
CSW contacted patient regarding a response on an EMMI call that she had provided. She had chosen an option that she was feeling sad. When CSW spoke with patient this afternoon she stated that she had chosen that option in error and that she stated she is doing wonderful. She is very excited that she is doing so well after being in the hospital for so many days. She states she has a close family that is very supportive. She declined having any needs at this time. York SpanielMonica Shauntavia Blair MSW,LCSW [phone]

## 2018-05-20 ENCOUNTER — Encounter: Payer: Self-pay | Admitting: Surgery

## 2018-05-20 ENCOUNTER — Ambulatory Visit (INDEPENDENT_AMBULATORY_CARE_PROVIDER_SITE_OTHER): Payer: PPO | Admitting: Surgery

## 2018-05-20 VITALS — BP 147/71 | HR 58 | Temp 97.8°F | Wt 144.0 lb

## 2018-05-20 DIAGNOSIS — Z09 Encounter for follow-up examination after completed treatment for conditions other than malignant neoplasm: Secondary | ICD-10-CM

## 2018-05-20 NOTE — Patient Instructions (Signed)
GENERAL POST-OPERATIVE PATIENT INSTRUCTIONS   WOUND CARE INSTRUCTIONS:  Keep a dry clean dressing on the wound if there is drainage. The initial bandage may be removed after 24 hours.  Once the wound has quit draining you may leave it open to air.  If clothing rubs against the wound or causes irritation and the wound is not draining you may cover it with a dry dressing during the daytime.  Try to keep the wound dry and avoid ointments on the wound unless directed to do so.  If the wound becomes bright red and painful or starts to drain infected material that is not clear, please contact your physician immediately.  If the wound is mildly pink and has a thick firm ridge underneath it, this is normal, and is referred to as a healing ridge.  This will resolve over the next 4-6 weeks.  BATHING: You may shower if you have been informed of this by your surgeon. However, Please do not submerge in a tub, hot tub, or pool until incisions are completely sealed or have been told by your surgeon that you may do so.  DIET:  You may eat any foods that you can tolerate.  It is a good idea to eat a high fiber diet and take in plenty of fluids to prevent constipation.  If you do become constipated you may want to take a mild laxative or take ducolax tablets on a daily basis until your bowel habits are regular.  Constipation can be very uncomfortable, along with straining, after recent surgery.  ACTIVITY:  You are encouraged to cough and deep breath or use your incentive spirometer if you were given one, every 15-30 minutes when awake.  This will help prevent respiratory complications and low grade fevers post-operatively if you had a general anesthetic.  You may want to hug a pillow when coughing and sneezing to add additional support to the surgical area, if you had abdominal or chest surgery, which will decrease pain during these times.  You are encouraged to walk and engage in light activity for the next two weeks.  You  should not lift more than 20 pounds, until 06/16/2018 as it could put you at increased risk for complications.  Twenty pounds is roughly equivalent to a plastic bag of groceries. At that time- Listen to your body when lifting, if you have pain when lifting, stop and then try again in a few days. Soreness after doing exercises or activities of daily living is normal as you get back in to your normal routine.  MEDICATIONS:  Try to take narcotic medications and anti-inflammatory medications, such as tylenol, ibuprofen, naprosyn, etc., with food.  This will minimize stomach upset from the medication.  Should you develop nausea and vomiting from the pain medication, or develop a rash, please discontinue the medication and contact your physician.  You should not drive, make important decisions, or operate machinery when taking narcotic pain medication.  SUNBLOCK Use sun block to incision area over the next year if this area will be exposed to sun. This helps decrease scarring and will allow you avoid a permanent darkened area over your incision.  QUESTIONS:  Please feel free to call our office if you have any questions, and we will be glad to assist you. [phone]   

## 2018-05-20 NOTE — Progress Notes (Signed)
S/p  SB resection for perf SB from FB Doing very well Taking PO Some occasional pains  No fevers or chills + BM Path d/w pt  PE NAD Abd: soft, minimal incisional tenderness, no peritonitis. There is Ecchymosis from the lovenox, no infection  A/P Doing very well , no surgical issues No heavy lifting RTC prn

## 2018-06-18 DIAGNOSIS — E7800 Pure hypercholesterolemia, unspecified: Secondary | ICD-10-CM | POA: Diagnosis not present

## 2018-06-18 DIAGNOSIS — E119 Type 2 diabetes mellitus without complications: Secondary | ICD-10-CM | POA: Diagnosis not present

## 2018-06-25 DIAGNOSIS — E119 Type 2 diabetes mellitus without complications: Secondary | ICD-10-CM | POA: Diagnosis not present

## 2018-06-25 DIAGNOSIS — I10 Essential (primary) hypertension: Secondary | ICD-10-CM | POA: Diagnosis not present

## 2018-06-25 DIAGNOSIS — E782 Mixed hyperlipidemia: Secondary | ICD-10-CM | POA: Diagnosis not present

## 2018-08-31 DIAGNOSIS — B372 Candidiasis of skin and nail: Secondary | ICD-10-CM | POA: Diagnosis not present

## 2018-10-16 ENCOUNTER — Emergency Department
Admission: EM | Admit: 2018-10-16 | Discharge: 2018-10-16 | Disposition: A | Discharge status: Home / Self Care | Payer: PPO | Attending: Emergency Medicine | Admitting: Emergency Medicine

## 2018-10-16 ENCOUNTER — Emergency Department: Payer: PPO

## 2018-10-16 ENCOUNTER — Other Ambulatory Visit: Payer: Self-pay

## 2018-10-16 ENCOUNTER — Encounter: Payer: Self-pay | Admitting: Emergency Medicine

## 2018-10-16 DIAGNOSIS — K5792 Diverticulitis of intestine, part unspecified, without perforation or abscess without bleeding: Secondary | ICD-10-CM | POA: Insufficient documentation

## 2018-10-16 DIAGNOSIS — K5730 Diverticulosis of large intestine without perforation or abscess without bleeding: Secondary | ICD-10-CM | POA: Diagnosis not present

## 2018-10-16 DIAGNOSIS — E119 Type 2 diabetes mellitus without complications: Secondary | ICD-10-CM | POA: Diagnosis not present

## 2018-10-16 DIAGNOSIS — Z79899 Other long term (current) drug therapy: Secondary | ICD-10-CM | POA: Insufficient documentation

## 2018-10-16 DIAGNOSIS — R1030 Lower abdominal pain, unspecified: Secondary | ICD-10-CM | POA: Diagnosis present

## 2018-10-16 DIAGNOSIS — I10 Essential (primary) hypertension: Secondary | ICD-10-CM | POA: Diagnosis not present

## 2018-10-16 DIAGNOSIS — Z7982 Long term (current) use of aspirin: Secondary | ICD-10-CM | POA: Diagnosis not present

## 2018-10-16 DIAGNOSIS — K439 Ventral hernia without obstruction or gangrene: Secondary | ICD-10-CM | POA: Diagnosis not present

## 2018-10-16 DIAGNOSIS — R399 Unspecified symptoms and signs involving the genitourinary system: Secondary | ICD-10-CM | POA: Diagnosis not present

## 2018-10-16 LAB — COMPREHENSIVE METABOLIC PANEL
ALBUMIN: 3.9 g/dL (ref 3.5–5.0)
ALT: 76 U/L — AB (ref 0–44)
AST: 39 U/L (ref 15–41)
Alkaline Phosphatase: 67 U/L (ref 38–126)
Anion gap: 8 (ref 5–15)
BUN: 16 mg/dL (ref 8–23)
CHLORIDE: 103 mmol/L (ref 98–111)
CO2: 25 mmol/L (ref 22–32)
CREATININE: 0.67 mg/dL (ref 0.44–1.00)
Calcium: 9.3 mg/dL (ref 8.9–10.3)
GFR calc Af Amer: >60 mL/min (ref >60)
GLUCOSE: 123 mg/dL — AB (ref 70–99)
POTASSIUM: 3.8 mmol/L (ref 3.5–5.1)
SODIUM: 136 mmol/L (ref 135–145)
Total Bilirubin: 0.8 mg/dL (ref 0.3–1.2)
Total Protein: 7.2 g/dL (ref 6.5–8.1)

## 2018-10-16 LAB — CBC
HCT: 41.6 % (ref 36.0–46.0)
HEMOGLOBIN: 13.1 g/dL (ref 12.0–15.0)
MCH: 27.1 pg (ref 26.0–34.0)
MCHC: 31.5 g/dL (ref 30.0–36.0)
MCV: 86 fL (ref 80.0–100.0)
Platelets: 318 10*3/uL (ref 150–400)
RBC: 4.84 MIL/uL (ref 3.87–5.11)
RDW: 13.4 % (ref 11.5–15.5)
WBC: 12.2 10*3/uL — AB (ref 4.0–10.5)
nRBC: 0 % (ref 0.0–0.2)

## 2018-10-16 LAB — LIPASE, BLOOD: LIPASE: 30 U/L (ref 11–51)

## 2018-10-16 MED ORDER — IOPAMIDOL (ISOVUE-300) INJECTION 61%
30.0000 mL | Freq: Once | INTRAVENOUS | Status: AC | PRN
  Administered 2018-10-16: 30 mL via ORAL
  Filled 2018-10-16: qty 30

## 2018-10-16 MED ORDER — CIPROFLOXACIN HCL 500 MG PO TABS
500.0000 mg | ORAL_TABLET | Freq: Once | ORAL | Status: AC
  Administered 2018-10-16: 500 mg via ORAL
  Filled 2018-10-16: qty 1

## 2018-10-16 MED ORDER — HYDROCODONE-ACETAMINOPHEN 5-325 MG PO TABS: 1.0000 | ORAL_TABLET | ORAL | 0 refills | Status: DC | PRN

## 2018-10-16 MED ORDER — IOPAMIDOL (ISOVUE-300) INJECTION 61%
100.0000 mL | Freq: Once | INTRAVENOUS | Status: AC | PRN
  Administered 2018-10-16: 100 mL via INTRAVENOUS
  Filled 2018-10-16: qty 100

## 2018-10-16 MED ORDER — ONDANSETRON HCL 4 MG/2ML IJ SOLN
4.0000 mg | Freq: Once | INTRAMUSCULAR | Status: AC
  Administered 2018-10-16: 4 mg via INTRAVENOUS
  Filled 2018-10-16: qty 2

## 2018-10-16 MED ORDER — METRONIDAZOLE 500 MG PO TABS: 500.0000 mg | ORAL_TABLET | Freq: Two times a day (BID) | ORAL | 0 refills | Status: DC

## 2018-10-16 MED ORDER — MORPHINE SULFATE (PF) 4 MG/ML IV SOLN
4.0000 mg | Freq: Once | INTRAVENOUS | Status: AC
  Administered 2018-10-16: 4 mg via INTRAVENOUS
  Filled 2018-10-16: qty 1

## 2018-10-16 MED ORDER — CIPROFLOXACIN HCL 500 MG PO TABS: 500.0000 mg | ORAL_TABLET | Freq: Two times a day (BID) | ORAL | 0 refills | Status: DC

## 2018-10-16 MED ORDER — METRONIDAZOLE 500 MG PO TABS
500.0000 mg | ORAL_TABLET | Freq: Once | ORAL | Status: AC
  Administered 2018-10-16: 500 mg via ORAL
  Filled 2018-10-16: qty 1

## 2018-10-16 NOTE — ED Triage Notes (Addendum)
Pt here for lower abdominal pain.  No vomiting but has been nauseated.  No diarrhea or fevers.  Pt reports nervous because had similar pain and had swallowed chicken bone and it ruptured intestine.  Had urine tested earlier today but no UTI; visible in care everywhere.  No blood work done. VSS. Ambulatory.

## 2018-10-16 NOTE — ED Provider Notes (Signed)
[facility] Emergency Department Provider Note   ____________________________________________    I have reviewed the triage vital signs and the nursing notes.   HISTORY  Chief Complaint Abdominal Pain     HPI Angela Blair is a 67 y.o. female with a history of diabetes who presents with complaints of abdominal pain.  Patient describes the pain is primarily in the left lower quadrant has been constant over the last 2 days.  She reports nausea but no vomiting.  Does have a significant history of bowel perforation, small bowel, operation 6 months ago apparently perforated by foreign body.  Denies fevers.  Has not take anything for this.  Denies dysuria although had a urinalysis performed by PCP today which was normal.   Past Medical History:  Diagnosis Date  . Hypercholesterolemia   . Hypertension    mild, does not want to take medications    Patient Active Problem List   Diagnosis Date Noted  . Bowel perforation (HCC) 05/05/2018  . Acute abdomen   . Essential hypertension 04/06/2018  . Diabetes mellitus without complication (HCC) 08/08/2014  . Pure hypercholesterolemia 08/08/2014    Past Surgical History:  Procedure Laterality Date  . ABDOMINAL HYSTERECTOMY    . APPENDECTOMY    . COLONOSCOPY  2013  . LAPAROTOMY N/A 05/05/2018   Procedure: EXPLORATORY LAPAROTOMY with small bowel resection, removal of foreign body;  Surgeon: Leafy Ro, MD;  Location: [facility] ORS;  Service: General;  Laterality: N/A;  photo taken of foreign body and placed on chart foreign body disposed of in sharps container    Prior to Admission medications   Medication Sig Start Date End Date Taking? Authorizing Provider  aspirin EC 81 MG tablet Take 81 mg by mouth daily.    [provider]  calcium-vitamin D (OSCAL WITH D) 500-200 MG-UNIT TABS tablet Take 1 tablet by mouth daily.    [provider]  ciprofloxacin (CIPRO) 500 MG tablet Take 1 tablet  (500 mg total) by mouth 2 (two) times daily. 10/16/18   Jene Every, MD  ezetimibe (ZETIA) 10 MG tablet Take 10 mg by mouth daily. 02/16/18 02/16/19  [provider]  HYDROcodone-acetaminophen (NORCO/VICODIN) 5-325 MG tablet Take 1 tablet by mouth every 4 (four) hours as needed for moderate pain. 10/16/18   Jene Every, MD  losartan (COZAAR) 50 MG tablet Take 50 mg by mouth daily. 04/06/18 04/06/19  [provider]  lovastatin (MEVACOR) 40 MG tablet Take 40 mg by mouth daily. 08/14/17 08/14/18  [provider]  metroNIDAZOLE (FLAGYL) 500 MG tablet Take 1 tablet (500 mg total) by mouth 2 (two) times daily after a meal. 10/16/18   Jene Every, MD     Allergies Patient has no known allergies.  Family History  Problem Relation Age of Onset  . Breast cancer Neg Hx     Social History Social History   Tobacco Use  . Smoking status: Never Smoker  . Smokeless tobacco: Never Used  Substance Use Topics  . Alcohol use: Not on file  . Drug use: Not on file    Review of Systems  Constitutional: No fever/chills Eyes: No visual changes.  ENT: No sore throat. Cardiovascular: Denies chest pain. Respiratory: Denies shortness of breath. Gastrointestinal: As above Genitourinary: Negative for dysuria. Musculoskeletal: Negative for back pain. Skin: Negative for rash. Neurological: Negative for headaches   ____________________________________________   PHYSICAL EXAM:  VITAL SIGNS: ED Triage Vitals  Enc Vitals Group  BP 10/16/18 1854 (!) 166/76     Pulse Rate 10/16/18 1854 99     Resp 10/16/18 1854 18     Temp 10/16/18 1854 99.2 F (37.3 C)     Temp Source 10/16/18 1854 Oral     SpO2 10/16/18 1854 99 %     Weight 10/16/18 1855 65.8 kg (145 lb)     Height 10/16/18 1855 1.575 m (5\' 2" )     Head Circumference --      Peak Flow --      Pain Score 10/16/18 1855 4     Pain Loc --      Pain Edu? --      Excl. in GC? --     Constitutional: Alert and  oriented.  Eyes: Conjunctivae are normal.   Nose: No congestion/rhinnorhea. Mouth/Throat: Mucous membranes are moist.    Cardiovascular: Normal rate, regular rhythm. Grossly normal heart sounds.  Good peripheral circulation. Respiratory: Normal respiratory effort.  No retractions. Lungs CTAB. Gastrointestinal: Left lower quadrant tenderness to palpation, mild. No distention.  No CVA tenderness. Genitourinary: deferred Musculoskeletal:  Warm and well perfused Neurologic:  Normal speech and language. No gross focal neurologic deficits are appreciated.  Skin:  Skin is warm, dry and intact. No rash noted. Psychiatric: Mood and affect are normal. Speech and behavior are normal.  ____________________________________________   LABS (all labs ordered are listed, but only abnormal results are displayed)  Labs Reviewed  COMPREHENSIVE METABOLIC PANEL - Abnormal; Notable for the following components:      Result Value   Glucose, Bld 123 (*)    ALT 76 (*)    All other components within normal limits  CBC - Abnormal; Notable for the following components:   WBC 12.2 (*)    All other components within normal limits  LIPASE, BLOOD   ____________________________________________  EKG  None ____________________________________________  RADIOLOGY  CT abdomen pelvis consistent with diverticulitis, no abscess or obstruction Discussed new ventral hernias with patient ____________________________________________   PROCEDURES  Procedure(s) performed: No  Procedures   Critical Care performed: No ____________________________________________   INITIAL IMPRESSION / ASSESSMENT AND PLAN / ED COURSE  Pertinent labs & imaging results that were available during my care of the patient were reviewed by me and considered in my medical decision making (see chart for details).  Patient overall well-appearing and in no acute distress.  She has mild to moderate tenderness palpation the left lower  quadrant suspicious for diverticulitis.  No significant distention to suggest small bowel obstruction.  Pending labs, has refused pain medication at this time.  Mildly elevated white blood cell count, CT obtained which demonstrates acute diverticulitis but no perforation  Patient received IV morphine, IV Zofran for pain.  Will treat with Flagyl, Cipro, Vicodin, soft diet, outpatient follow-up PCP.  Return precautions discussed    ____________________________________________   FINAL CLINICAL IMPRESSION(S) / ED DIAGNOSES  Final diagnoses:  Diverticulitis        Note:  This document was prepared using Dragon voice recognition software and may include unintentional dictation errors.    Jene EveryKinner, Leandrew Keech, MD 10/16/18 2101

## 2018-11-23 DIAGNOSIS — I10 Essential (primary) hypertension: Secondary | ICD-10-CM | POA: Diagnosis not present

## 2018-11-23 DIAGNOSIS — E119 Type 2 diabetes mellitus without complications: Secondary | ICD-10-CM | POA: Diagnosis not present

## 2018-11-23 DIAGNOSIS — E782 Mixed hyperlipidemia: Secondary | ICD-10-CM | POA: Diagnosis not present

## 2018-12-07 DIAGNOSIS — E782 Mixed hyperlipidemia: Secondary | ICD-10-CM | POA: Diagnosis not present

## 2018-12-07 DIAGNOSIS — E1169 Type 2 diabetes mellitus with other specified complication: Secondary | ICD-10-CM | POA: Diagnosis not present

## 2018-12-07 DIAGNOSIS — Z780 Asymptomatic menopausal state: Secondary | ICD-10-CM | POA: Diagnosis not present

## 2018-12-07 DIAGNOSIS — M19042 Primary osteoarthritis, left hand: Secondary | ICD-10-CM | POA: Diagnosis not present

## 2018-12-07 DIAGNOSIS — E785 Hyperlipidemia, unspecified: Secondary | ICD-10-CM | POA: Diagnosis not present

## 2018-12-07 DIAGNOSIS — I10 Essential (primary) hypertension: Secondary | ICD-10-CM | POA: Diagnosis not present

## 2018-12-07 DIAGNOSIS — M19041 Primary osteoarthritis, right hand: Secondary | ICD-10-CM | POA: Diagnosis not present

## 2018-12-16 DIAGNOSIS — M8588 Other specified disorders of bone density and structure, other site: Secondary | ICD-10-CM | POA: Diagnosis not present

## 2018-12-21 ENCOUNTER — Ambulatory Visit: Payer: PPO | Admitting: Surgery

## 2018-12-28 ENCOUNTER — Ambulatory Visit (INDEPENDENT_AMBULATORY_CARE_PROVIDER_SITE_OTHER): Payer: PPO | Admitting: Surgery

## 2018-12-28 ENCOUNTER — Other Ambulatory Visit: Payer: Self-pay

## 2018-12-28 ENCOUNTER — Encounter: Payer: Self-pay | Admitting: Surgery

## 2018-12-28 VITALS — BP 152/85 | HR 65 | Temp 97.7°F | Ht 62.0 in | Wt 147.0 lb

## 2018-12-28 DIAGNOSIS — K432 Incisional hernia without obstruction or gangrene: Secondary | ICD-10-CM | POA: Diagnosis not present

## 2018-12-28 NOTE — Patient Instructions (Addendum)
Patient will need to return to the office in 3 months to discuss surgery.  Call the office with any questions or concerns.

## 2018-12-28 NOTE — Progress Notes (Signed)
Outpatient Surgical Follow Up  12/28/2018  ESHAL HOBERMAN is an 68 y.o. female.   Chief Complaint  Patient presents with  . Follow-up     complaining of insision site surgery done 05/05/18 Dr. Everlene Farrier EXPLORATORY LAPAROTOMY with small bowel resection, removal of foreign body    HPI: Meeah is a 68 year old female known to me with a prior emergency surgery to include a laparotomy bowel resection for perforated small bowel from a foreign body (chicken bone).  He now comes with a bulging within the abdominal wall as well as some intermittent pain.  She describes the pain is mild to moderate intensity, worsening with Valsalva.  She feels like a bite.  No fevers no chills no weight loss.  No evidence of strangulation or incarceration.  No nausea no vomiting.  Normal bowel movements. She works very hard is able to perform more than 4 METS of activity without any shortness of breath or chest pain.  Of note she did have a recent episode of diverticulitis December 2019.  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  There was also evidence of diverticulitis at that time.  No abscess no free air.  That time her white count was slightly elevated to 12.2 and her rest of the labs including a CMP were completely normal She is HTN and high cholesterol, she is Baby aspirin. DM diet controlled.  Past Medical History:  Diagnosis Date  . Hypercholesterolemia   . Hypertension    mild, does not want to take medications    Past Surgical History:  Procedure Laterality Date  . ABDOMINAL HYSTERECTOMY    . APPENDECTOMY    . COLONOSCOPY  2013  . LAPAROTOMY N/A 05/05/2018   Procedure: EXPLORATORY LAPAROTOMY with small bowel resection, removal of foreign body;  Surgeon: Leafy Ro, MD;  Location: [facility] ORS;  Service: General;  Laterality: N/A;  photo taken of foreign body and placed on chart foreign body disposed of in sharps  container    Family History  Problem Relation Age of Onset  . Breast cancer Neg Hx     Social History:  reports that she has never smoked. She has never used smokeless tobacco. No history on file for alcohol and drug.  Allergies:  Allergies  Allergen Reactions  . Statins Other (See Comments)    Intolerance - arthralgia    Medications reviewed.   ROS Full ROS performed and is otherwise negative other than what is stated in HPI   BP (!) 152/85   Pulse 65   Temp 97.7 F (36.5 C) (Temporal)   Ht 5\' 2"  (1.575 m)   Wt 147 lb (66.7 kg)   SpO2 97%   BMI 26.89 kg/m   Physical Exam Vitals signs and nursing note reviewed. Exam conducted with a chaperone present.  Constitutional:      Appearance: Normal appearance. She is normal weight.  Neck:     Musculoskeletal: Normal range of motion and neck supple. No neck rigidity or muscular tenderness.  Cardiovascular:     Rate and Rhythm: Normal rate and regular rhythm.     Heart sounds: No murmur. No gallop.   Pulmonary:     Effort: Pulmonary effort is normal. No respiratory distress.     Breath sounds: Normal breath sounds. No stridor. No wheezing or rhonchi.  Abdominal:     General: Abdomen is flat. Bowel sounds are normal. There  is no distension.     Palpations: There is no mass.     Tenderness: There is no abdominal tenderness. There is no guarding or rebound.     Hernia: A hernia is present.     Comments: There is a large ventral hernia with multiple defects mainly above the umbilicus.  The scar has healed well.  There is mild tenderness to palpation when I reduce the hernia. No Peritonitis  Skin:    Capillary Refill: Capillary refill takes less than 2 seconds.  Neurological:     General: No focal deficit present.     Mental Status: She is alert and oriented to person, place, and time.  Psychiatric:        Mood and Affect: Mood normal.        Behavior: Behavior normal.        Thought Content: Thought content normal.         Judgment: Judgment normal.         Assessment/Plan:  68 year old female with previous laparotomy for foreign body last year now presents with a recurrent symptomatic and complex ventral hernia.  I have had an extensive discussion with patient regarding her disease process.  Given that this is a Swiss cheese defect and is along the midline I do think that she will probably be better served with abdominal wall reconstruction and Vassie Moment repair and mesh placement.  I do not necessarily think that we need to do at T8 are released.  Another alternate option would be a robotic IPOM approach however given that it is a Swiss cheese defect and there is some diastases of the midline I think that there is a higher chance of recurrences using this approach. She is not quite ready for any surgical procedures at this time and she wishes to wait at least 3 months until she makes a final determination.  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  At this time there is no need for any emergent surgical intervention.   Greater than 50% of the 40 minutes  visit was spent in counseling/coordination of care   Sterling Big, MD [facility] General Surgeon

## 2019-01-06 DIAGNOSIS — J019 Acute sinusitis, unspecified: Secondary | ICD-10-CM | POA: Diagnosis not present

## 2019-03-22 ENCOUNTER — Ambulatory Visit (INDEPENDENT_AMBULATORY_CARE_PROVIDER_SITE_OTHER): Payer: PPO | Admitting: Surgery

## 2019-03-22 ENCOUNTER — Other Ambulatory Visit: Payer: Self-pay

## 2019-03-22 ENCOUNTER — Encounter: Payer: Self-pay | Admitting: Surgery

## 2019-03-22 VITALS — BP 157/78 | HR 66 | Temp 97.2°F | Resp 14 | Ht 62.0 in | Wt 147.0 lb

## 2019-03-22 DIAGNOSIS — K432 Incisional hernia without obstruction or gangrene: Secondary | ICD-10-CM | POA: Diagnosis not present

## 2019-03-22 NOTE — Progress Notes (Signed)
Outpatient Surgical Follow Up  03/22/2019  Angela Blair is an 68 y.o. female.   Chief Complaint  Patient presents with  . Follow-up    hernia    HPI: Angela Blair following up for a ventral hernia after exploratory laparotomy for a foreign body removed last year.  She is doing well overall but has some intermittent lower abdominal pain especially at night.  The pain is moderate to mild in intensity.  No evidence of incarceration or strangulation.  She continues to work very hard  Past Medical History:  Diagnosis Date  . Hypercholesterolemia   . Hypertension    mild, does not want to take medications    Past Surgical History:  Procedure Laterality Date  . ABDOMINAL HYSTERECTOMY    . APPENDECTOMY    . COLONOSCOPY  2013  . LAPAROTOMY N/A 05/05/2018   Procedure: EXPLORATORY LAPAROTOMY with small bowel resection, removal of foreign body;  Surgeon: Leafy Ro, MD;  Location: [facility] ORS;  Service: General;  Laterality: N/A;  photo taken of foreign body and placed on chart foreign body disposed of in sharps container    Family History  Problem Relation Age of Onset  . Breast cancer Neg Hx     Social History:  reports that she has never smoked. She has never used smokeless tobacco. No history on file for alcohol and drug.  Allergies:  Allergies  Allergen Reactions  . Statins Other (See Comments)    Intolerance - arthralgia    Medications reviewed.    ROS Full ROS performed and is otherwise negative other than what is stated in HPI   BP (!) 157/78   Pulse 66   Temp (!) 97.2 F (36.2 C)   Resp 14   Ht 5\' 2"  (1.575 m)   Wt 147 lb (66.7 kg)   SpO2 96%   BMI 26.89 kg/m   Physical Exam Vitals signs and nursing note reviewed.  Constitutional:      Appearance: Normal appearance.  Pulmonary:     Effort: Pulmonary effort is normal.     Breath sounds: No stridor.  Abdominal:     General: Abdomen is flat. There is no distension.     Palpations: Abdomen is soft.  There is no mass.     Tenderness: There is no abdominal tenderness. There is no rebound.     Hernia: A hernia is present.     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  The hernia reduces easily.  No peritonitis  Musculoskeletal: Normal range of motion.  Skin:    General: Skin is warm and dry.     Capillary Refill: Capillary refill takes less than 2 seconds.     Coloration: Skin is not pale.  Neurological:     General: No focal deficit present.     Mental Status: She is alert and oriented to person, place, and time.  Psychiatric:        Mood and Affect: Mood normal.        Behavior: Behavior normal.        Thought Content: Thought content normal.        Judgment: Judgment normal.         Assessment/Plan: Old female with a symptomatic ventral hernia with some loss of domain.  Discussed with patient in detail at this point she wishes to postpone her surgery for about 6 to 8 weeks.  Still think that probably Angela Blair Stoppa repair with mesh placement doing in an open fashion will achieve the best results and least recurrence rates.  Is with the patient in detail about the operation.  Risk benefits and potential complication.  She will come back in about 4 to 6 weeks to talk more about the planned operation and to pin down a definitive date.  Greater than 50% of the 15 minutes  visit was spent in counseling/coordination of care   Sterling Bigiego Cleopatra Sardo, MD [facility] General Surgeon

## 2019-03-22 NOTE — Patient Instructions (Signed)
Follow up here in 5 weeks. Call with any problems or worsening symptoms.

## 2019-04-05 DIAGNOSIS — I10 Essential (primary) hypertension: Secondary | ICD-10-CM | POA: Diagnosis not present

## 2019-04-05 DIAGNOSIS — E785 Hyperlipidemia, unspecified: Secondary | ICD-10-CM | POA: Diagnosis not present

## 2019-04-05 DIAGNOSIS — E1169 Type 2 diabetes mellitus with other specified complication: Secondary | ICD-10-CM | POA: Diagnosis not present

## 2019-04-05 DIAGNOSIS — E782 Mixed hyperlipidemia: Secondary | ICD-10-CM | POA: Diagnosis not present

## 2019-04-12 DIAGNOSIS — E785 Hyperlipidemia, unspecified: Secondary | ICD-10-CM | POA: Diagnosis not present

## 2019-04-12 DIAGNOSIS — Z0000 Encounter for general adult medical examination without abnormal findings: Secondary | ICD-10-CM | POA: Diagnosis not present

## 2019-04-12 DIAGNOSIS — E1169 Type 2 diabetes mellitus with other specified complication: Secondary | ICD-10-CM | POA: Diagnosis not present

## 2019-04-12 DIAGNOSIS — I10 Essential (primary) hypertension: Secondary | ICD-10-CM | POA: Diagnosis not present

## 2019-04-12 DIAGNOSIS — E782 Mixed hyperlipidemia: Secondary | ICD-10-CM | POA: Diagnosis not present

## 2019-04-12 DIAGNOSIS — M19042 Primary osteoarthritis, left hand: Secondary | ICD-10-CM | POA: Diagnosis not present

## 2019-04-12 DIAGNOSIS — M19041 Primary osteoarthritis, right hand: Secondary | ICD-10-CM | POA: Diagnosis not present

## 2019-04-15 ENCOUNTER — Other Ambulatory Visit: Payer: Self-pay | Admitting: Family Medicine

## 2019-04-15 DIAGNOSIS — Z1231 Encounter for screening mammogram for malignant neoplasm of breast: Secondary | ICD-10-CM

## 2019-04-26 ENCOUNTER — Other Ambulatory Visit: Payer: Self-pay

## 2019-04-26 ENCOUNTER — Ambulatory Visit
Admission: RE | Admit: 2019-04-26 | Discharge: 2019-04-26 | Disposition: A | Discharge status: Home / Self Care | Payer: PPO | Source: Ambulatory Visit | Attending: Family Medicine | Admitting: Family Medicine

## 2019-04-26 ENCOUNTER — Ambulatory Visit: Payer: PPO | Admitting: Surgery

## 2019-04-26 DIAGNOSIS — Z1231 Encounter for screening mammogram for malignant neoplasm of breast: Secondary | ICD-10-CM | POA: Diagnosis not present

## 2019-05-10 ENCOUNTER — Ambulatory Visit (INDEPENDENT_AMBULATORY_CARE_PROVIDER_SITE_OTHER): Payer: PPO | Admitting: Surgery

## 2019-05-10 ENCOUNTER — Encounter: Payer: Self-pay | Admitting: Surgery

## 2019-05-10 ENCOUNTER — Other Ambulatory Visit: Payer: Self-pay

## 2019-05-10 ENCOUNTER — Telehealth: Payer: Self-pay | Admitting: *Deleted

## 2019-05-10 VITALS — BP 171/82 | HR 61 | Temp 97.9°F | Resp 16 | Ht 61.0 in | Wt 146.4 lb

## 2019-05-10 DIAGNOSIS — K432 Incisional hernia without obstruction or gangrene: Secondary | ICD-10-CM

## 2019-05-10 NOTE — H&P (View-Only) (Signed)
Outpatient Surgical Follow Up  05/10/2019  Angela Blair is an 68 y.o. female.   Chief Complaint  Patient presents with  . Follow-up    HPI: 68-year-old female well-known to me w a history of small bowel perforation, from FB.  Now has developed a large ventral hernia with some loss of domain in the central upper portion of her abdomen.  Angela Blair experiencing intermittent abdominal discomfort, her pain is usually at night intermittent, mild to moderate intensity.  No evidence of bowel obstruction no evidence of incarceration.  Angela Blair is eating regular diet, and otherwise doing very well.  Angela Blair is able to perform more than 4 METS of activity without any shortness of breath or chest pain.   Past Medical History:  Diagnosis Date  . Hypercholesterolemia   . Hypertension    mild, does not want to take medications    Past Surgical History:  Procedure Laterality Date  . ABDOMINAL HYSTERECTOMY    . APPENDECTOMY    . COLONOSCOPY  2013  . LAPAROTOMY N/A 05/05/2018   Procedure: EXPLORATORY LAPAROTOMY with small bowel resection, removal of foreign body;  Surgeon: Leafy RoPabon, Moody Robben F, MD;  Location: [facility] ORS;  Service: General;  Laterality: N/A;  photo taken of foreign body and placed on chart foreign body disposed of in sharps container    Family History  Problem Relation Age of Onset  . Breast cancer Neg Hx     Social History:  reports that Angela Blair has never smoked. Angela Blair has never used smokeless tobacco. No history on file for alcohol and drug.  Allergies:  Allergies  Allergen Reactions  . Statins Other (See Comments)    Intolerance - arthralgia    Medications reviewed.    ROS Full ROS performed and is otherwise negative other than what is stated in HPI   BP (!) 171/82   Pulse 61   Temp 97.9 F (36.6 C) (Temporal)   Resp 16   Ht 5\' 1"  (1.549 m)   Wt 146 lb 6.4 oz (66.4 kg)   SpO2 97%   BMI 27.66 kg/m   Physical Exam Vitals signs and nursing note reviewed. Exam conducted with a  chaperone present.  Constitutional:      Appearance: Normal appearance. Angela Blair is normal weight.  Eyes:     General: No scleral icterus.       Right eye: No discharge.        Left eye: No discharge.  Neck:     Musculoskeletal: Normal range of motion and neck supple. No neck rigidity.  Cardiovascular:     Rate and Rhythm: Normal rate and regular rhythm.  Pulmonary:     Effort: Pulmonary effort is normal. No respiratory distress.     Breath sounds: Normal breath sounds. No wheezing.  Abdominal:     General: Abdomen is flat. There is no distension.     Palpations: Abdomen is soft. There is no mass.     Tenderness: There is no guarding.     Hernia: A hernia is present.     Comments: Large ventral hernia with some loss of central domain, reduces.  Skin:    General: Skin is warm and dry.     Capillary Refill: Capillary refill takes less than 2 seconds.  Neurological:     General: No focal deficit present.     Mental Status: Angela Blair is alert and oriented to person, place, and time.  Psychiatric:        Mood and Affect: Mood normal.  Behavior: Behavior normal.        Thought Content: Thought content normal.        Judgment: Judgment normal.        Assessment/Plan:  This 67 year old female with a symptomatic recurrent large ventral hernia need for repair.  I have discussed with the patient and her son in detail about the procedure.  Risk, benefits and possible applications including but not limited to: Bleeding, infection, mesh issues, chronic pain, small bowel injuries.  They understand and they wish to proceed.  I do think that the best approach for her is an open abdominal wall reconstructions with either a TAR release or more likely Junie Panning with mesh. All questions were answered Greater than 50% of the 40 minutes  visit was spent in counseling/coordination of care   Caroleen Hamman, MD [place] Surgeon

## 2019-05-10 NOTE — Progress Notes (Signed)
Outpatient Surgical Follow Up  05/10/2019  Angela Blair is an 68 y.o. female.   Chief Complaint  Patient presents with  . Follow-up    HPI: 8-year-old female well-known to me w a history of small bowel perforation, from FB.  Now has developed a large ventral hernia with some loss of domain in the central upper portion of her abdomen.  She experiencing intermittent abdominal discomfort, her pain is usually at night intermittent, mild to moderate intensity.  No evidence of bowel obstruction no evidence of incarceration.  She is eating regular diet, and otherwise doing very well.  She is able to perform more than 4 METS of activity without any shortness of breath or chest pain.   Past Medical History:  Diagnosis Date  . Hypercholesterolemia   . Hypertension    mild, does not want to take medications    Past Surgical History:  Procedure Laterality Date  . ABDOMINAL HYSTERECTOMY    . APPENDECTOMY    . COLONOSCOPY  2013  . LAPAROTOMY N/A 05/05/2018   Procedure: EXPLORATORY LAPAROTOMY with small bowel resection, removal of foreign body;  Surgeon: Reah Justo F, MD;  Location: [facility] ORS;  Service: General;  Laterality: N/A;  photo taken of foreign body and placed on chart foreign body disposed of in sharps container    Family History  Problem Relation Age of Onset  . Breast cancer Neg Hx     Social History:  reports that she has never smoked. She has never used smokeless tobacco. No history on file for alcohol and drug.  Allergies:  Allergies  Allergen Reactions  . Statins Other (See Comments)    Intolerance - arthralgia    Medications reviewed.    ROS Full ROS performed and is otherwise negative other than what is stated in HPI   BP (!) 171/82   Pulse 61   Temp 97.9 F (36.6 C) (Temporal)   Resp 16   Ht 5' 1" (1.549 m)   Wt 146 lb 6.4 oz (66.4 kg)   SpO2 97%   BMI 27.66 kg/m   Physical Exam Vitals signs and nursing note reviewed. Exam conducted with a  chaperone present.  Constitutional:      Appearance: Normal appearance. She is normal weight.  Eyes:     General: No scleral icterus.       Right eye: No discharge.        Left eye: No discharge.  Neck:     Musculoskeletal: Normal range of motion and neck supple. No neck rigidity.  Cardiovascular:     Rate and Rhythm: Normal rate and regular rhythm.  Pulmonary:     Effort: Pulmonary effort is normal. No respiratory distress.     Breath sounds: Normal breath sounds. No wheezing.  Abdominal:     General: Abdomen is flat. There is no distension.     Palpations: Abdomen is soft. There is no mass.     Tenderness: There is no guarding.     Hernia: A hernia is present.     Comments: Large ventral hernia with some loss of central domain, reduces.  Skin:    General: Skin is warm and dry.     Capillary Refill: Capillary refill takes less than 2 seconds.  Neurological:     General: No focal deficit present.     Mental Status: She is alert and oriented to person, place, and time.  Psychiatric:        Mood and Affect: Mood normal.          Behavior: Behavior normal.        Thought Content: Thought content normal.        Judgment: Judgment normal.        Assessment/Plan:  This 67 year old female with a symptomatic recurrent large ventral hernia need for repair.  I have discussed with the patient and her son in detail about the procedure.  Risk, benefits and possible applications including but not limited to: Bleeding, infection, mesh issues, chronic pain, small bowel injuries.  They understand and they wish to proceed.  I do think that the best approach for her is an open abdominal wall reconstructions with either a TAR release or more likely Junie Panning with mesh. All questions were answered Greater than 50% of the 40 minutes  visit was spent in counseling/coordination of care   Caroleen Hamman, MD [place] Surgeon

## 2019-05-10 NOTE — Telephone Encounter (Signed)
Patient contacted today and would like surgery scheduled for 06-01-19 with Dr. Dahlia Byes.   The patient is aware to have COVID-19 testing done on 05-28-19 at the [facility] drive thru ([address] [place]). She is aware to isolate after, have no visitors, wash hands frequently, and avoid touching face.   The patient is aware she will need to Pre-Admit. We will try and coordinate this with her COVID testing. She wants this done early morning. Patient will check in at the [place] entrance due to COVID-19 restrictions and will then be escorted to the [place], [address] (first floor). Patient will be contacted once Pre-admission appointment has been arranged with date and time.

## 2019-05-10 NOTE — Patient Instructions (Addendum)
We will call you later today to schedule your surgery.    Ventral Hernia  A ventral hernia is a bulge of tissue from inside the abdomen that pushes through a weak area of the muscles that form the front wall of the abdomen. The tissues inside the abdomen are inside a sac (peritoneum). These tissues include the small intestine, large intestine, and the fatty tissue that covers the intestines (omentum). Sometimes, the bulge that forms a hernia contains intestines. Other hernias contain only fat. Ventral hernias do not go away without surgical treatment. There are several types of ventral hernias. You may have:  A hernia at an incision site from previous abdominal surgery (incisional hernia).  A hernia just above the belly button (epigastric hernia), or at the belly button (umbilical hernia). These types of hernias can develop from heavy lifting or straining.  A hernia that comes and goes (reducible hernia). It may be visible only when you lift or strain. This type of hernia can be pushed back into the abdomen (reduced).  A hernia that traps abdominal tissue inside the hernia (incarcerated hernia). This type of hernia does not reduce.  A hernia that cuts off blood flow to the tissues inside the hernia (strangulated hernia). The tissues can start to die if this happens. This is a very painful bulge that cannot be reduced. A strangulated hernia is a medical emergency. What are the causes? This condition is caused by abdominal tissue putting pressure on an area of weakness in the abdominal muscles. What increases the risk? The following factors may make you more likely to develop this condition:  Being female.  Being 27 or older.  Being overweight or obese.  Having had previous abdominal surgery, especially if there was an infection after surgery.  Having had an injury to the abdominal wall.  Having had several pregnancies.  Having a buildup of fluid inside the abdomen (ascites). What are  the signs or symptoms? The only symptom of a ventral hernia may be a painless bulge in the abdomen. A reducible hernia may be visible only when you strain, cough, or lift. Other symptoms may include:  Dull pain.  A feeling of pressure. Signs and symptoms of a strangulated hernia may include:  Increasing pain.  Nausea and vomiting.  Pain when pressing on the hernia.  The skin over the hernia turning red or purple.  Constipation.  Blood in the stool (feces). How is this diagnosed? This condition may be diagnosed based on:  Your symptoms.  Your medical history.  A physical exam. You may be asked to cough or strain while standing. These actions increase the pressure inside your abdomen and force the hernia through the opening in your muscles. Your health care provider may try to reduce the hernia by pressing on it.  Imaging studies, such as an ultrasound or CT scan. How is this treated? This condition is treated with surgery. If you have a strangulated hernia, surgery is done as soon as possible. If your hernia is small and not incarcerated, you may be asked to lose some weight before surgery. Follow these instructions at home:  Follow instructions from your health care provider about eating or drinking restrictions.  If you are overweight, your health care provider may recommend that you increase your activity level and eat a healthier diet.  Do not lift anything that is heavier than 10 lb (4.5 kg).  Return to your normal activities as told by your health care provider. Ask your health care  provider what activities are safe for you. You may need to avoid activities that increase pressure on your hernia.  Take over-the-counter and prescription medicines only as told by your health care provider.  Keep all follow-up visits as told by your health care provider. This is important. Contact a health care provider if:  Your hernia gets larger.  Your hernia becomes painful. Get  help right away if:  Your hernia becomes increasingly painful.  You have pain along with any of the following: ? Changes in skin color in the area of the hernia. ? Nausea. ? Vomiting. ? Fever. Summary  A ventral hernia is a bulge of tissue from inside the abdomen that pushes through a weak area of the muscles that form the front wall of the abdomen.  This condition is treated with surgery, which may be urgent depending on your hernia.  Do not lift anything that is heavier than 10 lb (4.5 kg), and follow activity instructions from your health care provider. This information is not intended to replace advice given to you by your health care provider. Make sure you discuss any questions you have with your health care provider. Document Released: 10/07/2012 Document Revised: 12/03/2017 Document Reviewed: 05/12/2017 Elsevier Patient Education  2020 [employer]

## 2019-05-11 NOTE — Telephone Encounter (Signed)
Message left for patient on her home number.   I did also call and speak with her son, Robert Bellow, today. He was notified that we will not be able to do her surgery on 06-01-19. Just wanted to see if she would maybe like to do this on 06-08-19 instead. He is asking [place] to call her back on the work number around 4:30 pm today. He was notified that she has not heard from [place] a little after that time to please call our office.

## 2019-05-11 NOTE — Telephone Encounter (Signed)
Per Caryl-Lyn, patient called the office back and is okay with surgery on 06-08-19.  We will work on getting this scheduled and will call her back once [facility] has posted.

## 2019-05-13 NOTE — Telephone Encounter (Signed)
Patient contacted today and aware that surgery has been scheduled for 06-08-19 with Dr. Dahlia Byes. Dr. Genevive Bi to assist.   The patient is aware to pre-admit on 06-04-19 at 7:30 am and have COVID testing done immediately after. She is aware to report to the [facility] entrance for in office Pre-admit appointment and aware to go to the [facility] drive thru for COVID testing.   Instructions have also been mailed to the patient.   She is aware to call the office if she has further questions.

## 2019-06-04 ENCOUNTER — Encounter
Admission: RE | Admit: 2019-06-04 | Discharge: 2019-06-04 | Disposition: A | Discharge status: Home / Self Care | Payer: PPO | Source: Ambulatory Visit | Attending: Surgery | Admitting: Surgery

## 2019-06-04 ENCOUNTER — Other Ambulatory Visit: Payer: PPO

## 2019-06-04 ENCOUNTER — Other Ambulatory Visit: Payer: Self-pay

## 2019-06-04 DIAGNOSIS — Z01810 Encounter for preprocedural cardiovascular examination: Secondary | ICD-10-CM | POA: Diagnosis not present

## 2019-06-04 DIAGNOSIS — Z20828 Contact with and (suspected) exposure to other viral communicable diseases: Secondary | ICD-10-CM | POA: Insufficient documentation

## 2019-06-04 DIAGNOSIS — I10 Essential (primary) hypertension: Secondary | ICD-10-CM | POA: Diagnosis not present

## 2019-06-04 HISTORY — DX: Other complications of anesthesia, initial encounter: T88.59XA

## 2019-06-04 HISTORY — DX: Nausea with vomiting, unspecified: Z98.890

## 2019-06-04 HISTORY — DX: Nausea with vomiting, unspecified: R11.2

## 2019-06-04 NOTE — Patient Instructions (Signed)
Your procedure is scheduled on: Tuesday 06/08/19 Report to [facility]. To find out your arrival time please call [phone] between 1PM - 3PM on Monday 06/07/19.  Remember: Instructions that are not followed completely may result in serious medical risk, up to and including death, or upon the discretion of your surgeon and anesthesiologist your surgery may need to be rescheduled.     _X__ 1. Do not eat food after midnight the night before your procedure.                 No gum chewing or hard candies. You may drink clear liquids up to 2 hours                 before you are scheduled to arrive for your surgery- DO not drink clear                 liquids within 2 hours of the start of your surgery.                 Clear Liquids include:  water, apple juice without pulp, clear carbohydrate                 drink such as Clearfast or Gatorade, Black Coffee or Tea (Do not add                 anything to coffee or tea). Diabetics water only  __X__2.  On the morning of surgery brush your teeth with toothpaste and water, you                 may rinse your mouth with mouthwash if you wish.  Do not swallow any              toothpaste of mouthwash.     _X__ 3.  No Alcohol for 24 hours before or after surgery.   _X__ 4.  Do Not Smoke or use e-cigarettes For 24 Hours Prior to Your Surgery.                 Do not use any chewable tobacco products for at least 6 hours prior to                 surgery.  ____  5.  Bring all medications with you on the day of surgery if instructed.   __X__  6.  Notify your doctor if there is any change in your medical condition      (cold, fever, infections).     Do not wear jewelry, make-up, hairpins, clips or nail polish. Do not wear lotions, powders, or perfumes.  Do not shave 48 hours prior to surgery. Men may shave face and neck. Do not bring valuables to the hospital.    [facility] is not responsible for any  belongings or valuables.  Contacts, dentures/partials or body piercings may not be worn into surgery. Bring a case for your contacts, glasses or hearing aids, a denture cup will be supplied. Leave your suitcase in the car. After surgery it may be brought to your room. For patients admitted to the hospital, discharge time is determined by your treatment team.   Patients discharged the day of surgery will not be allowed to drive home.   Please read over the following fact sheets that you were given:   MRSA Information  __X__ Take these medicines the morning of surgery with A SIP OF WATER:  1. none  2.   3.   4.  5.  6.  ____ Fleet Enema (as directed)   __X__ Use CHG Soap/SAGE wipes as directed  ____ Use inhalers on the day of surgery  ____ Stop metformin/Janumet/Farxiga 2 days prior to surgery    ____ Take 1/2 of usual insulin dose the night before surgery. No insulin the morning          of surgery.   ____ Stop Blood Thinners Coumadin/Plavix/Xarelto/Pleta/Pradaxa/Eliquis/Effient/Aspirin  on   Or contact your Surgeon, Cardiologist or Medical Doctor regarding  ability to stop your blood thinners  __X__ Stop Anti-inflammatories 7 days before surgery such as Advil, Ibuprofen, Motrin,  BC or Goodies Powder, Naprosyn, Naproxen, Aleve, Aspirin    __X__ Stop all herbal supplements, fish oil or vitamin E until after surgery.  Your Vitamin D is OK to continue thru Monday  ____ Bring C-Pap to the hospital.

## 2019-06-05 LAB — SARS CORONAVIRUS 2 (TAT 6-24 HRS): SARS Coronavirus 2: NEGATIVE

## 2019-06-08 ENCOUNTER — Inpatient Hospital Stay: Payer: PPO | Admitting: Certified Registered Nurse Anesthetist

## 2019-06-08 ENCOUNTER — Encounter: Payer: Self-pay | Admitting: *Deleted

## 2019-06-08 ENCOUNTER — Other Ambulatory Visit: Payer: Self-pay

## 2019-06-08 ENCOUNTER — Inpatient Hospital Stay
Admission: RE | Admit: 2019-06-08 | Discharge: 2019-06-14 | DRG: 337 | Disposition: A | Discharge status: Home / Self Care | Payer: PPO | Attending: Surgery | Admitting: Surgery

## 2019-06-08 ENCOUNTER — Encounter
Admission: RE | Disposition: A | Discharge status: Home / Self Care | Payer: Self-pay | Source: Home / Self Care | Attending: Surgery

## 2019-06-08 DIAGNOSIS — K432 Incisional hernia without obstruction or gangrene: Principal | ICD-10-CM | POA: Diagnosis present

## 2019-06-08 DIAGNOSIS — E7800 Pure hypercholesterolemia, unspecified: Secondary | ICD-10-CM | POA: Diagnosis not present

## 2019-06-08 DIAGNOSIS — Z888 Allergy status to other drugs, medicaments and biological substances status: Secondary | ICD-10-CM

## 2019-06-08 DIAGNOSIS — Z90710 Acquired absence of both cervix and uterus: Secondary | ICD-10-CM

## 2019-06-08 DIAGNOSIS — K439 Ventral hernia without obstruction or gangrene: Secondary | ICD-10-CM | POA: Diagnosis not present

## 2019-06-08 DIAGNOSIS — I10 Essential (primary) hypertension: Secondary | ICD-10-CM | POA: Diagnosis not present

## 2019-06-08 DIAGNOSIS — E119 Type 2 diabetes mellitus without complications: Secondary | ICD-10-CM | POA: Diagnosis not present

## 2019-06-08 DIAGNOSIS — K660 Peritoneal adhesions (postprocedural) (postinfection): Secondary | ICD-10-CM | POA: Diagnosis present

## 2019-06-08 DIAGNOSIS — Z79899 Other long term (current) drug therapy: Secondary | ICD-10-CM

## 2019-06-08 DIAGNOSIS — Z9889 Other specified postprocedural states: Secondary | ICD-10-CM

## 2019-06-08 DIAGNOSIS — Z8719 Personal history of other diseases of the digestive system: Secondary | ICD-10-CM

## 2019-06-08 HISTORY — PX: VENTRAL HERNIA REPAIR: SHX424

## 2019-06-08 LAB — CBC
HCT: 44 % (ref 36.0–46.0)
Hemoglobin: 13.9 g/dL (ref 12.0–15.0)
MCH: 27.9 pg (ref 26.0–34.0)
MCHC: 31.6 g/dL (ref 30.0–36.0)
MCV: 88.2 fL (ref 80.0–100.0)
Platelets: 263 10*3/uL (ref 150–400)
RBC: 4.99 MIL/uL (ref 3.87–5.11)
RDW: 13.2 % (ref 11.5–15.5)
WBC: 10.2 10*3/uL (ref 4.0–10.5)
nRBC: 0 % (ref 0.0–0.2)

## 2019-06-08 LAB — CREATININE, SERUM
Creatinine, Ser: 0.59 mg/dL (ref 0.44–1.00)
GFR calc Af Amer: >60 mL/min
GFR calc non Af Amer: >60 mL/min

## 2019-06-08 SURGERY — REPAIR, HERNIA, VENTRAL
Anesthesia: General

## 2019-06-08 MED ORDER — DEXAMETHASONE SODIUM PHOSPHATE 10 MG/ML IJ SOLN
INTRAMUSCULAR | Status: DC | PRN
  Administered 2019-06-08: 8 mg via INTRAVENOUS

## 2019-06-08 MED ORDER — BUPIVACAINE LIPOSOME 1.3 % IJ SUSP
INTRAMUSCULAR | Status: AC
  Filled 2019-06-08: qty 20

## 2019-06-08 MED ORDER — FENTANYL CITRATE (PF) 100 MCG/2ML IJ SOLN
INTRAMUSCULAR | Status: AC
  Filled 2019-06-08: qty 2

## 2019-06-08 MED ORDER — HEPARIN SODIUM (PORCINE) 5000 UNIT/ML IJ SOLN
INTRAMUSCULAR | Status: AC
  Administered 2019-06-08: 5000 [IU] via SUBCUTANEOUS
  Filled 2019-06-08: qty 1

## 2019-06-08 MED ORDER — ACETAMINOPHEN 500 MG PO TABS
ORAL_TABLET | ORAL | Status: AC
  Administered 2019-06-08: 10:00:00 1000 mg via ORAL
  Filled 2019-06-08: qty 2

## 2019-06-08 MED ORDER — SUGAMMADEX SODIUM 200 MG/2ML IV SOLN
INTRAVENOUS | Status: AC
  Filled 2019-06-08: qty 2

## 2019-06-08 MED ORDER — LIDOCAINE HCL (CARDIAC) PF 100 MG/5ML IV SOSY
PREFILLED_SYRINGE | INTRAVENOUS | Status: DC | PRN
  Administered 2019-06-08: 80 mg via INTRAVENOUS

## 2019-06-08 MED ORDER — CELECOXIB 200 MG PO CAPS
ORAL_CAPSULE | ORAL | Status: AC
  Administered 2019-06-08: 10:00:00 200 mg via ORAL
  Filled 2019-06-08: qty 1

## 2019-06-08 MED ORDER — HYDROMORPHONE HCL 1 MG/ML IJ SOLN
INTRAMUSCULAR | Status: AC
  Filled 2019-06-08: qty 1

## 2019-06-08 MED ORDER — OXYCODONE HCL 5 MG PO TABS
5.0000 mg | ORAL_TABLET | ORAL | Status: DC | PRN
  Administered 2019-06-14: 5 mg via ORAL
  Filled 2019-06-08: qty 1

## 2019-06-08 MED ORDER — FENTANYL CITRATE (PF) 100 MCG/2ML IJ SOLN
INTRAMUSCULAR | Status: DC | PRN
  Administered 2019-06-08 (×2): 50 ug via INTRAVENOUS

## 2019-06-08 MED ORDER — PROCHLORPERAZINE MALEATE 10 MG PO TABS
10.0000 mg | ORAL_TABLET | Freq: Four times a day (QID) | ORAL | Status: DC | PRN
  Filled 2019-06-08: qty 1

## 2019-06-08 MED ORDER — BUPIVACAINE-EPINEPHRINE (PF) 0.25% -1:200000 IJ SOLN
INTRAMUSCULAR | Status: AC
  Filled 2019-06-08: qty 60

## 2019-06-08 MED ORDER — CEFAZOLIN SODIUM-DEXTROSE 2-3 GM-%(50ML) IV SOLR
INTRAVENOUS | Status: DC | PRN
  Administered 2019-06-08: 2 g via INTRAVENOUS

## 2019-06-08 MED ORDER — PROPOFOL 500 MG/50ML IV EMUL
INTRAVENOUS | Status: DC | PRN
  Administered 2019-06-08: 20 ug/kg/min via INTRAVENOUS

## 2019-06-08 MED ORDER — CEFAZOLIN SODIUM 1 G IJ SOLR
INTRAMUSCULAR | Status: AC
  Filled 2019-06-08: qty 20

## 2019-06-08 MED ORDER — CYCLOBENZAPRINE HCL 10 MG PO TABS
5.0000 mg | ORAL_TABLET | Freq: Three times a day (TID) | ORAL | Status: DC | PRN
  Administered 2019-06-14: 05:00:00 5 mg via ORAL
  Filled 2019-06-08: qty 1

## 2019-06-08 MED ORDER — CEFAZOLIN SODIUM-DEXTROSE 2-4 GM/100ML-% IV SOLN
INTRAVENOUS | Status: AC
  Filled 2019-06-08: qty 100

## 2019-06-08 MED ORDER — SUCCINYLCHOLINE CHLORIDE 20 MG/ML IJ SOLN
INTRAMUSCULAR | Status: DC | PRN
  Administered 2019-06-08: 100 mg via INTRAVENOUS

## 2019-06-08 MED ORDER — FENTANYL CITRATE (PF) 100 MCG/2ML IJ SOLN: 25.0000 ug | INTRAMUSCULAR | Status: DC | PRN

## 2019-06-08 MED ORDER — ENOXAPARIN SODIUM 40 MG/0.4ML ~~LOC~~ SOLN
40.0000 mg | SUBCUTANEOUS | Status: DC
  Administered 2019-06-09 – 2019-06-14 (×6): 40 mg via SUBCUTANEOUS
  Filled 2019-06-08 (×6): qty 0.4

## 2019-06-08 MED ORDER — CHLORHEXIDINE GLUCONATE CLOTH 2 % EX PADS: 6.0000 | MEDICATED_PAD | Freq: Once | CUTANEOUS | Status: DC

## 2019-06-08 MED ORDER — ONDANSETRON HCL 4 MG/2ML IJ SOLN
4.0000 mg | Freq: Four times a day (QID) | INTRAMUSCULAR | Status: DC | PRN
  Administered 2019-06-08 – 2019-06-12 (×3): 4 mg via INTRAVENOUS
  Filled 2019-06-08 (×4): qty 2

## 2019-06-08 MED ORDER — SUGAMMADEX SODIUM 200 MG/2ML IV SOLN
INTRAVENOUS | Status: DC | PRN
  Administered 2019-06-08: 150 mg via INTRAVENOUS

## 2019-06-08 MED ORDER — HYDRALAZINE HCL 20 MG/ML IJ SOLN: 10.0000 mg | INTRAMUSCULAR | Status: DC | PRN

## 2019-06-08 MED ORDER — ONDANSETRON HCL 4 MG/2ML IJ SOLN: 4.0000 mg | Freq: Once | INTRAMUSCULAR | Status: DC | PRN

## 2019-06-08 MED ORDER — SODIUM CHLORIDE 0.9 % IV SOLN
INTRAVENOUS | Status: DC
  Administered 2019-06-08 – 2019-06-09 (×2): via INTRAVENOUS

## 2019-06-08 MED ORDER — PROPOFOL 10 MG/ML IV BOLUS
INTRAVENOUS | Status: DC | PRN
  Administered 2019-06-08: 150 mg via INTRAVENOUS

## 2019-06-08 MED ORDER — ONDANSETRON HCL 4 MG/2ML IJ SOLN
INTRAMUSCULAR | Status: DC | PRN
  Administered 2019-06-08: 4 mg via INTRAVENOUS

## 2019-06-08 MED ORDER — MORPHINE SULFATE (PF) 2 MG/ML IV SOLN: 2.0000 mg | INTRAVENOUS | Status: DC | PRN

## 2019-06-08 MED ORDER — GABAPENTIN 600 MG PO TABS
300.0000 mg | ORAL_TABLET | Freq: Three times a day (TID) | ORAL | Status: DC
  Administered 2019-06-08 – 2019-06-09 (×4): 300 mg via ORAL
  Filled 2019-06-08 (×4): qty 1

## 2019-06-08 MED ORDER — ONDANSETRON 4 MG PO TBDP: 4.0000 mg | ORAL_TABLET | Freq: Four times a day (QID) | ORAL | Status: DC | PRN

## 2019-06-08 MED ORDER — LACTATED RINGERS IV SOLN
INTRAVENOUS | Status: DC
  Administered 2019-06-08 (×2): via INTRAVENOUS

## 2019-06-08 MED ORDER — FAMOTIDINE 20 MG PO TABS
20.0000 mg | ORAL_TABLET | Freq: Once | ORAL | Status: AC
  Administered 2019-06-08: 20 mg via ORAL

## 2019-06-08 MED ORDER — PANTOPRAZOLE SODIUM 40 MG IV SOLR
40.0000 mg | Freq: Every day | INTRAVENOUS | Status: DC
  Administered 2019-06-08 – 2019-06-11 (×3): 40 mg via INTRAVENOUS
  Filled 2019-06-08 (×5): qty 40

## 2019-06-08 MED ORDER — ROCURONIUM BROMIDE 100 MG/10ML IV SOLN
INTRAVENOUS | Status: DC | PRN
  Administered 2019-06-08: 20 mg via INTRAVENOUS
  Administered 2019-06-08: 35 mg via INTRAVENOUS
  Administered 2019-06-08: 20 mg via INTRAVENOUS
  Administered 2019-06-08: 5 mg via INTRAVENOUS

## 2019-06-08 MED ORDER — HEPARIN SODIUM (PORCINE) 5000 UNIT/ML IJ SOLN
5000.0000 [IU] | Freq: Once | INTRAMUSCULAR | Status: AC
  Administered 2019-06-08: 10:00:00 5000 [IU] via SUBCUTANEOUS

## 2019-06-08 MED ORDER — EZETIMIBE 10 MG PO TABS
10.0000 mg | ORAL_TABLET | Freq: Every day | ORAL | Status: DC
  Administered 2019-06-09 – 2019-06-14 (×6): 10 mg via ORAL
  Filled 2019-06-08 (×6): qty 1

## 2019-06-08 MED ORDER — MIDAZOLAM HCL 2 MG/2ML IJ SOLN
INTRAMUSCULAR | Status: DC | PRN
  Administered 2019-06-08: 2 mg via INTRAVENOUS

## 2019-06-08 MED ORDER — SODIUM CHLORIDE (PF) 0.9 % IJ SOLN
INTRAMUSCULAR | Status: AC
  Filled 2019-06-08: qty 10

## 2019-06-08 MED ORDER — KETOROLAC TROMETHAMINE 30 MG/ML IJ SOLN
30.0000 mg | Freq: Four times a day (QID) | INTRAMUSCULAR | Status: DC
  Administered 2019-06-08: 30 mg via INTRAVENOUS
  Filled 2019-06-08: qty 1

## 2019-06-08 MED ORDER — KETOROLAC TROMETHAMINE 30 MG/ML IJ SOLN
15.0000 mg | Freq: Four times a day (QID) | INTRAMUSCULAR | Status: AC
  Administered 2019-06-09 – 2019-06-12 (×13): 15 mg via INTRAVENOUS
  Filled 2019-06-08 (×15): qty 1

## 2019-06-08 MED ORDER — ONDANSETRON HCL 4 MG/2ML IJ SOLN
INTRAMUSCULAR | Status: AC
  Filled 2019-06-08: qty 2

## 2019-06-08 MED ORDER — CELECOXIB 200 MG PO CAPS
200.0000 mg | ORAL_CAPSULE | ORAL | Status: AC
  Administered 2019-06-08: 200 mg via ORAL

## 2019-06-08 MED ORDER — MIDAZOLAM HCL 2 MG/2ML IJ SOLN
INTRAMUSCULAR | Status: AC
  Filled 2019-06-08: qty 2

## 2019-06-08 MED ORDER — ACETAMINOPHEN 500 MG PO TABS
1000.0000 mg | ORAL_TABLET | Freq: Four times a day (QID) | ORAL | Status: DC
  Administered 2019-06-08 – 2019-06-14 (×17): 1000 mg via ORAL
  Filled 2019-06-08 (×20): qty 2

## 2019-06-08 MED ORDER — FAMOTIDINE 20 MG PO TABS
ORAL_TABLET | ORAL | Status: AC
  Administered 2019-06-08: 20 mg via ORAL
  Filled 2019-06-08: qty 1

## 2019-06-08 MED ORDER — SCOPOLAMINE 1 MG/3DAYS TD PT72
1.0000 | MEDICATED_PATCH | TRANSDERMAL | Status: DC
  Administered 2019-06-08: 10:00:00 1.5 mg via TRANSDERMAL

## 2019-06-08 MED ORDER — CEFAZOLIN SODIUM-DEXTROSE 2-4 GM/100ML-% IV SOLN
2.0000 g | INTRAVENOUS | Status: AC
  Administered 2019-06-08: 2 g via INTRAVENOUS

## 2019-06-08 MED ORDER — BUPIVACAINE LIPOSOME 1.3 % IJ SUSP: 20.0000 mL | Freq: Once | INTRAMUSCULAR | Status: DC

## 2019-06-08 MED ORDER — SODIUM CHLORIDE 0.9 % IV SOLN
INTRAVENOUS | Status: DC | PRN
  Administered 2019-06-08: 17:00:00 20 mL

## 2019-06-08 MED ORDER — PROPOFOL 10 MG/ML IV BOLUS
INTRAVENOUS | Status: AC
  Filled 2019-06-08: qty 20

## 2019-06-08 MED ORDER — PROCHLORPERAZINE EDISYLATE 10 MG/2ML IJ SOLN
5.0000 mg | Freq: Four times a day (QID) | INTRAMUSCULAR | Status: DC | PRN
  Filled 2019-06-08: qty 2

## 2019-06-08 MED ORDER — CEFAZOLIN SODIUM-DEXTROSE 2-4 GM/100ML-% IV SOLN
2.0000 g | Freq: Three times a day (TID) | INTRAVENOUS | Status: AC
  Administered 2019-06-08 – 2019-06-09 (×2): 2 g via INTRAVENOUS
  Filled 2019-06-08 (×2): qty 100

## 2019-06-08 MED ORDER — GABAPENTIN 300 MG PO CAPS
300.0000 mg | ORAL_CAPSULE | ORAL | Status: AC
  Administered 2019-06-08: 300 mg via ORAL

## 2019-06-08 MED ORDER — ACETAMINOPHEN 500 MG PO TABS
1000.0000 mg | ORAL_TABLET | ORAL | Status: AC
  Administered 2019-06-08: 1000 mg via ORAL

## 2019-06-08 MED ORDER — DEXAMETHASONE SODIUM PHOSPHATE 10 MG/ML IJ SOLN
INTRAMUSCULAR | Status: AC
  Filled 2019-06-08: qty 1

## 2019-06-08 MED ORDER — EPHEDRINE SULFATE 50 MG/ML IJ SOLN
INTRAMUSCULAR | Status: DC | PRN
  Administered 2019-06-08 (×2): 5 mg via INTRAVENOUS

## 2019-06-08 MED ORDER — GABAPENTIN 300 MG PO CAPS
ORAL_CAPSULE | ORAL | Status: AC
  Administered 2019-06-08: 10:00:00 300 mg via ORAL
  Filled 2019-06-08: qty 1

## 2019-06-08 MED ORDER — SCOPOLAMINE 1 MG/3DAYS TD PT72
MEDICATED_PATCH | TRANSDERMAL | Status: AC
  Administered 2019-06-08: 10:00:00 1.5 mg via TRANSDERMAL
  Filled 2019-06-08: qty 1

## 2019-06-08 MED ORDER — SODIUM CHLORIDE (PF) 0.9 % IJ SOLN
INTRAMUSCULAR | Status: AC
  Filled 2019-06-08: qty 50

## 2019-06-08 MED ORDER — HYDROMORPHONE HCL 1 MG/ML IJ SOLN
INTRAMUSCULAR | Status: DC | PRN
  Administered 2019-06-08 (×3): .2 mg via INTRAVENOUS

## 2019-06-08 MED ORDER — BUPIVACAINE-EPINEPHRINE (PF) 0.25% -1:200000 IJ SOLN
INTRAMUSCULAR | Status: DC | PRN
  Administered 2019-06-08: 30 mL

## 2019-06-08 SURGICAL SUPPLY — 46 items
APPLICATOR COTTON TIP 6 STRL (MISCELLANEOUS) ×2 IMPLANT
APPLICATOR COTTON TIP 6IN STRL (MISCELLANEOUS) ×6
APPLIER CLIP 11 MED OPEN (CLIP) ×3
APPLIER CLIP 13 LRG OPEN (CLIP) ×3
BARRIER ADH SEPRAFILM 3INX5IN (MISCELLANEOUS) ×6 IMPLANT
BLADE CLIPPER SURG (BLADE) ×3 IMPLANT
BLADE SURG 15 STRL LF DISP TIS (BLADE) ×1 IMPLANT
BLADE SURG 15 STRL SS (BLADE) ×2
BULB RESERV EVAC DRAIN JP 100C (MISCELLANEOUS) ×6 IMPLANT
CANISTER SUCT 3000ML PPV (MISCELLANEOUS) ×3 IMPLANT
CHLORAPREP W/TINT 26 (MISCELLANEOUS) ×3 IMPLANT
CLIP APPLIE 11 MED OPEN (CLIP) ×1 IMPLANT
CLIP APPLIE 13 LRG OPEN (CLIP) ×1 IMPLANT
COVER WAND RF STERILE (DRAPES) ×3 IMPLANT
DRAIN CHANNEL JP 19F (MISCELLANEOUS) ×6 IMPLANT
DRAPE LAPAROTOMY 100X77 ABD (DRAPES) ×3 IMPLANT
DRAPE MAG INST 16X20 L/F (DRAPES) ×3 IMPLANT
DRSG TELFA 3X8 NADH (GAUZE/BANDAGES/DRESSINGS) ×3 IMPLANT
ELECT BLADE 6.5 EXT (BLADE) ×3 IMPLANT
ELECT CAUTERY BLADE 6.4 (BLADE) ×3 IMPLANT
ELECT REM PT RETURN 9FT ADLT (ELECTROSURGICAL) ×3
ELECTRODE REM PT RTRN 9FT ADLT (ELECTROSURGICAL) ×1 IMPLANT
GAUZE SPONGE 4X4 12PLY STRL (GAUZE/BANDAGES/DRESSINGS) ×3 IMPLANT
GLOVE BIO SURGEON STRL SZ7 (GLOVE) ×12 IMPLANT
GOWN STRL REUS W/ TWL LRG LVL3 (GOWN DISPOSABLE) ×4 IMPLANT
GOWN STRL REUS W/TWL LRG LVL3 (GOWN DISPOSABLE) ×8
MESH SOFT 12X12IN BARD (Mesh General) ×3 IMPLANT
NEEDLE HYPO 22GX1.5 SAFETY (NEEDLE) ×3 IMPLANT
NEEDLE HYPO 25X1 1.5 SAFETY (NEEDLE) ×3 IMPLANT
PACK BASIN MINOR ARMC (MISCELLANEOUS) ×3 IMPLANT
PREVENA INCISION MGT 90 150 (MISCELLANEOUS) ×3 IMPLANT
SPONGE KITTNER 5P (MISCELLANEOUS) ×6 IMPLANT
SPONGE LAP 18X18 RF (DISPOSABLE) ×15 IMPLANT
STAPLER SKIN PROX 35W (STAPLE) ×3 IMPLANT
SUT ETHIBOND 0 MO6 C/R (SUTURE) IMPLANT
SUT PDS AB 0 CT1 27 (SUTURE) ×12 IMPLANT
SUT SILK 2 0 (SUTURE) ×4
SUT SILK 2 0SH CR/8 30 (SUTURE) ×3 IMPLANT
SUT SILK 2-0 18XBRD TIE 12 (SUTURE) ×2 IMPLANT
SUT VIC AB 2-0 SH 27 (SUTURE)
SUT VIC AB 2-0 SH 27XBRD (SUTURE) IMPLANT
SUT VIC AB 3-0 SH 27 (SUTURE) ×8
SUT VIC AB 3-0 SH 27X BRD (SUTURE) ×4 IMPLANT
SYR 20ML LL LF (SYRINGE) ×3 IMPLANT
TAPE MICROFOAM 4IN (TAPE) ×3 IMPLANT
WATER STERILE IRR 1000ML POUR (IV SOLUTION) ×3 IMPLANT

## 2019-06-08 NOTE — Progress Notes (Signed)
15 minute call to floor. 

## 2019-06-08 NOTE — Op Note (Signed)
PROCEDURES: 1. Abdominal wall reconstruction with bilateral myocutaneous flaps using a TAR release and a medium weight macro porus polypropylene mesh measuring 30 x 30 cm,  2. Incisional hernia repair 3. 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 with endotracheal tube  Assistant: Dr. Genevive Bi ( essential due to the complexity of the procedure and to have adequate exposure)  Findings: Large ventral hernia with loss of domain, unable to bring midline together with Rives-Stoppa and TAR release required Great coverage of defect with reconstruction of  he abd wall   Estimated Blood Loss: 50cc         Drains: 19 FR x 2               Complications: none        Condition: stable  Procedure Details  The patient was seen again in the Holding Room. The benefits, complications, treatment options, and expected outcomes were discussed with the patient. The risks of bleeding, infection, recurrence of symptoms, failure to resolve symptoms,  bowel injury, any of which could require further surgery were reviewed with the patient.   The patient was taken to Operating Room, identified as Angela Blair and the procedure verified.  A Time Out was held and the above information confirmed.  Generous elliptical incision was created from pubis to xiphoid incorporating the old scar.  The subcutaneous tissue is divided and the fascia and the hernia sac are identified. The peritoneal cavity was entered.  Extensive adhesions were performed using a combination of electrocautery and Metzenbaum scissors.  There was significant adhesions of the small bowel to the abdominal wall and the omentum to the abdominal wall.  Once we had good lysis of adhesions we  entered the retrorectus space by identifying/ incising the posterior sheath. This dissection is then performed laterally to the linea semilunaris, the most lateral aspect of the retrorectus space, using a combination of both blunt and sharp dissection   The retromuscular space is then bluntly developed to as far as the lateral border of the psoas muscle. The dissection is repeated on the opposite site and  Was carried superiorly to the central tendon of the diaphragm, using a subxiphoid and retrosternal dissection. Dissection also  was developed inferiorly to the retropubic space.   Next, the posterior rectus sheaths are approximated to one another with 0 PDS suture.  Using liposomal Marcaine TAP Block was performed on each side under direct visualization.   I Selected a 30 x 30 cm cms macroporus polypropylene mesh and in a diamond configuration place it anterior to the peritoneum.  Mesh was Fix it to both the pubic and the side foot with interrupted 0 PDS sutures.  The mesh lay very flat on in a corset fashion.  I was very happy with the repair.  A 19 [place] Blake drain was placed in the retrorectus space in the standard fashion.  This retrorectus drain was placed on the Left superior abdominal wall.  Attention Was turned to the anterior sheath.  This was approximated with multiple running 0 PDS sutures.  We made sure that we released some of the tension that was attached to the subcutaneous tissue. And the significant that space I decided to also place a 19  Blake drain within the subcutaneous tissue.  The exit of this drain was to the Right lower upper quadrant.  The wound was irrigated with saline and sterile water.  It was approximated with a staples and a Prevena plus wound VAC was placed in the standard fashion.  Needle and laparotomy count were correct and there were no immediate complications  Angela Bigiego Alize Acy, MD, FACS

## 2019-06-08 NOTE — Progress Notes (Signed)
RN request spiritual presence to offer support following surgery. Lorre Nick is [place] Orthodox. Dino (son) present and reported spiritual provider offered support to [place] and family prior to surgery. Lorre Nick was tearful following prayer. Chaplain offered emotional support and spiritual blessing to aid in comfort. Chaplain will hand-off to incoming chaplain to follow-up during post-of.     06/08/19 1800  Clinical Encounter Type  Visited With Patient and family together  Visit Type Initial;Post-op  Referral From Nurse  Spiritual Encounters  Spiritual Needs Emotional;Prayer

## 2019-06-08 NOTE — Anesthesia Post-op Follow-up Note (Signed)
Anesthesia QCDR form completed.        

## 2019-06-08 NOTE — Anesthesia Procedure Notes (Signed)
Procedure Name: Intubation Date/Time: 06/08/2019 12:31 PM Performed by: Dionne Bucy, CRNA Pre-anesthesia Checklist: Patient identified, Patient being monitored, Timeout performed, Emergency Drugs available and Suction available Patient Re-evaluated:Patient Re-evaluated prior to induction Oxygen Delivery Method: Circle system utilized Preoxygenation: Pre-oxygenation with 100% oxygen Induction Type: IV induction Ventilation: Mask ventilation without difficulty Laryngoscope Size: 3 and McGraph Grade View: Grade I Tube type: Oral Tube size: 7.0 mm Number of attempts: 1 Airway Equipment and Method: Stylet and Video-laryngoscopy Placement Confirmation: ETT inserted through vocal cords under direct vision,  positive ETCO2 and breath sounds checked- equal and bilateral Secured at: 21 cm Tube secured with: Tape Dental Injury: Teeth and Oropharynx as per pre-operative assessment

## 2019-06-08 NOTE — Anesthesia Postprocedure Evaluation (Signed)
Anesthesia Post Note  Patient: Angela Blair  Procedure(s) Performed: HERNIA REPAIR VENTRAL ADULT, OPEN, ABDOMINAL WALL RECONSTRUCTION (N/A )  Patient location during evaluation: PACU Anesthesia Type: General Level of consciousness: awake and alert Pain management: pain level controlled Vital Signs Assessment: post-procedure vital signs reviewed and stable Respiratory status: spontaneous breathing, nonlabored ventilation, respiratory function stable and patient connected to nasal cannula oxygen Cardiovascular status: blood pressure returned to baseline and stable Postop Assessment: no apparent nausea or vomiting Anesthetic complications: no     Last Vitals:  Vitals:   06/08/19 1700 06/08/19 1722  BP: 122/71 (!) 147/60  Pulse: 61 60  Resp: 11 16  Temp:  36.5 C  SpO2: 97% 98%    Last Pain:  Vitals:   06/08/19 1722  TempSrc: Oral  PainSc:                  Martha Clan

## 2019-06-08 NOTE — Anesthesia Preprocedure Evaluation (Addendum)
Anesthesia Evaluation  Patient identified by MRN, date of birth, ID band Patient awake    Reviewed: Allergy & Precautions, H&P , NPO status , Patient's Chart, lab work & pertinent test results, reviewed documented beta blocker date and time   History of Anesthesia Complications (+) PONV and history of anesthetic complications  Airway Mallampati: II  TM Distance: >3 FB Neck ROM: full    Dental  (+) Poor Dentition   Pulmonary neg pulmonary ROS,    Pulmonary exam normal        Cardiovascular Exercise Tolerance: Poor hypertension, On Medications negative cardio ROS Normal cardiovascular exam Rhythm:regular Rate:Normal     Neuro/Psych negative neurological ROS  negative psych ROS   GI/Hepatic Neg liver ROS,   Endo/Other  negative endocrine ROSdiabetes  Renal/GU negative Renal ROS  negative genitourinary   Musculoskeletal   Abdominal   Peds negative pediatric ROS (+)  Hematology negative hematology ROS (+)   Anesthesia Other Findings Past Medical History: No date: Complication of anesthesia     Comment:  after bowel surgery pt states she was "talking out of               her head" No date: Hypercholesterolemia No date: Hypertension     Comment:  mild, does not want to take medications No date: PONV (postoperative nausea and vomiting) Past Surgical History: No date: ABDOMINAL HYSTERECTOMY No date: APPENDECTOMY 2013: COLONOSCOPY 05/05/2018: LAPAROTOMY; N/A     Comment:  Procedure: EXPLORATORY LAPAROTOMY with small bowel               resection, removal of foreign body;  Surgeon: Jules Husbands, MD;  Location: [facility] ORS;  Service: General;                Laterality: N/A;  photo taken of foreign body and placed               on chart foreign body disposed of in sharps container BMI    Body Mass Index: 27.02 kg/m     Reproductive/Obstetrics negative OB ROS                             Anesthesia Physical Anesthesia Plan  ASA: III  Anesthesia Plan: General   Post-op Pain Management:    Induction:   PONV Risk Score and Plan:   Airway Management Planned: Oral ETT  Additional Equipment:   Intra-op Plan:   Post-operative Plan: Extubation in OR  Informed Consent: I have reviewed the patients History and Physical, chart, labs and discussed the procedure including the risks, benefits and alternatives for the proposed anesthesia with the patient or authorized representative who has indicated his/her understanding and acceptance.     Dental Advisory Given  Plan Discussed with: CRNA  Anesthesia Plan Comments:        Anesthesia Quick Evaluation

## 2019-06-08 NOTE — Transfer of Care (Signed)
Immediate Anesthesia Transfer of Care Note  Patient: Angela Blair  Procedure(s) Performed: HERNIA REPAIR VENTRAL ADULT, OPEN, ABDOMINAL WALL RECONSTRUCTION (N/A )  Patient Location: PACU  Anesthesia Type:General  Level of Consciousness: sedated  Airway & Oxygen Therapy: Patient Spontanous Breathing and Patient connected to face mask oxygen  Post-op Assessment: Report given to RN and Post -op Vital signs reviewed and stable  Post vital signs: Reviewed and stable  Last Vitals:  Vitals Value Taken Time  BP 117/53 06/08/19 1613  Temp 36.4 C 06/08/19 1613  Pulse 62 06/08/19 1626  Resp 15 06/08/19 1626  SpO2 100 % 06/08/19 1626  Vitals shown include unvalidated device data.  Last Pain:  Vitals:   06/08/19 1613  TempSrc:   PainSc: Asleep         Complications: No apparent anesthesia complications

## 2019-06-08 NOTE — Progress Notes (Signed)
Dentures returned to patient. 

## 2019-06-08 NOTE — Interval H&P Note (Signed)
History and Physical Interval Note:  06/08/2019 12:17 PM  Angela Blair  has presented today for surgery, with the diagnosis of K43.2 VENTRAL HERNIA.  The various methods of treatment have been discussed with the patient and family. After consideration of risks, benefits and other options for treatment, the patient has consented to  Procedure(s): HERNIA REPAIR VENTRAL ADULT, OPEN, ABDOMINAL WALL RECONSTRUCTION (N/A) as a surgical intervention.  The patient's history has been reviewed, patient examined, no change in status, stable for surgery.  I have reviewed the patient's chart and labs.  Questions were answered to the patient's satisfaction.     [place]

## 2019-06-09 ENCOUNTER — Encounter: Payer: Self-pay | Admitting: Surgery

## 2019-06-09 LAB — CBC
HCT: 38.3 % (ref 36.0–46.0)
Hemoglobin: 12.3 g/dL (ref 12.0–15.0)
MCH: 27.7 pg (ref 26.0–34.0)
MCHC: 32.1 g/dL (ref 30.0–36.0)
MCV: 86.3 fL (ref 80.0–100.0)
Platelets: 242 10*3/uL (ref 150–400)
RBC: 4.44 MIL/uL (ref 3.87–5.11)
RDW: 13.4 % (ref 11.5–15.5)
WBC: 8.9 10*3/uL (ref 4.0–10.5)
nRBC: 0 % (ref 0.0–0.2)

## 2019-06-09 LAB — BASIC METABOLIC PANEL
Anion gap: 8 (ref 5–15)
BUN: 13 mg/dL (ref 8–23)
CO2: 25 mmol/L (ref 22–32)
Calcium: 8.5 mg/dL — ABNORMAL LOW (ref 8.9–10.3)
Chloride: 106 mmol/L (ref 98–111)
Creatinine, Ser: 0.71 mg/dL (ref 0.44–1.00)
GFR calc Af Amer: >60 mL/min (ref >60)
GFR calc non Af Amer: >60 mL/min (ref >60)
Glucose, Bld: 151 mg/dL — ABNORMAL HIGH (ref 70–99)
Potassium: 4.4 mmol/L (ref 3.5–5.1)
Sodium: 139 mmol/L (ref 135–145)

## 2019-06-09 MED ORDER — ALBUMIN HUMAN 25 % IV SOLN
12.5000 g | Freq: Once | INTRAVENOUS | Status: AC
  Administered 2019-06-09: 12.5 g via INTRAVENOUS
  Filled 2019-06-09: qty 50

## 2019-06-09 NOTE — Progress Notes (Signed)
POD # 1 DOing well Prevena issues Currently working but beeping. Rep contacted and will come today AVSS Eating regular diet. Labs ok Good UO JP 65cc  PE NAD Abd: soft, prevena w suction. JPS serosanguinous  A/P DOing well Dc foley heplock mobilize Continue pain control and prevena VAC DC 24-48 hrs

## 2019-06-09 NOTE — Plan of Care (Signed)
Patient currently has a wound vac set to 126mmHg. Prevena wound vac charging in the room. No falls. Ambulated in the hallway one lap. Orthostatic vitals obtained as the patient has been worried about her diastolic blood pressure.  Problem: Education: Goal: Required Educational Video(s) Outcome: Progressing   Problem: Clinical Measurements: Goal: Ability to maintain clinical measurements within normal limits will improve Outcome: Progressing Goal: Postoperative complications will be avoided or minimized Outcome: Progressing   Problem: Skin Integrity: Goal: Demonstration of wound healing without infection will improve Outcome: Progressing

## 2019-06-09 NOTE — Progress Notes (Signed)
MD notified: FYI, blood pressure is soft 107/47 (MAP 65) checked twice, Heart rate 59.

## 2019-06-09 NOTE — Progress Notes (Signed)
Subjective:  CC: Angela Blair is a 67 y.o. female  Hospital stay day 1, 1 Day Post-Op Abdominal wall reconstruction with bilateral myocutaneous flaps using a TAR release   HPI: Notified by RN that her Praveena wound VAC is consistently beeping.  Description of the beeping consistent with a leak and asked RN to troubleshoot to see if it resolves.  RN was unable to fix the issue.  Patient currently is comfortable and not have any specific complaints.  ROS:  General: Denies fevers, chills, and night sweats. Heart: Denies chest pain, palpitations, racing heart, irregular heartbeat, leg pain or swelling,  Respiratory: Denies breathing difficulty, shortness of breath, wheezing, cough, and sputum. GI: Denies nausea, vomiting, constipation, diarrhea, and blood in stool. GU: Denies recent difficulty urinating, pain with urinating, urgency, frequency, blood in urine.  Foley in place   Objective:   Temp:  [97 F (36.1 C)-98 F (36.7 C)] 97 F (36.1 C) (08/04 1813) Pulse Rate:  [57-68] 58 (08/04 1813) Resp:  [0-16] 16 (08/04 1813) BP: (117-157)/(53-76) 145/70 (08/04 1813) SpO2:  [97 %-100 %] 97 % (08/04 1813) Weight:  [67 kg] 67 kg (08/04 0927)     Height: 5\' 2"  (157.5 cm) Weight: 67 kg BMI (Calculated): 27.01   Intake/Output this shift:   Intake/Output Summary (Last 24 hours) at 06/09/2019 0448 Last data filed at 06/09/2019 0325 Gross per 24 hour  Intake 2105.57 ml  Output 560 ml  Net 1545.57 ml    Constitutional :  alert, cooperative, appears stated age and no distress        Gastrointestinal: Soft, no guarding, both JP with serosanguineous fluid.  Praveena wound VAC in place in the midline incision, with compressed sponge.  However, the machine is indicating a air leak..  Focal tenderness to palpation around incision as well as the drain sites.  Skin: Cool and moist.   Psychiatric: Normal affect, non-agitated, not confused       LABS:  n/a RADS: n/a Assessment:   Postop  day 1 Abdominal wall reconstruction with bilateral myocutaneous flaps using a TAR release.  Doing well postop as expected except for the persistent air leak warning from the Praveena wound VAC.  Sponge continues to look decompressed and under suction, with no obvious areas that could indicate origins of the leak.  Attempted to troubleshoot but unable to resolve the persistent warning.  Since the sponge does look like it is holding partial negative pressure, recommend continuing the wound VAC until reassessed by Dr. Harlow Ohms team in the a.m.  Nurse and patient along with patient's son at bedside all in agreement with plan at this time.

## 2019-06-09 NOTE — Progress Notes (Signed)
MD notified: orthostatics  vitals obtained lying 114/47 (MAP 65), HR  57, Sitting 119/50 (MAP 70) HR 61, Standing 111/55(MAP 72), HR 63.

## 2019-06-10 DIAGNOSIS — K432 Incisional hernia without obstruction or gangrene: Principal | ICD-10-CM

## 2019-06-10 LAB — HIV ANTIBODY (ROUTINE TESTING W REFLEX): HIV Screen 4th Generation wRfx: NONREACTIVE

## 2019-06-10 MED ORDER — GABAPENTIN 100 MG PO CAPS
100.0000 mg | ORAL_CAPSULE | Freq: Three times a day (TID) | ORAL | Status: DC
  Administered 2019-06-10 – 2019-06-12 (×7): 100 mg via ORAL
  Filled 2019-06-10 (×9): qty 1

## 2019-06-10 MED ORDER — POLYETHYLENE GLYCOL 3350 17 G PO PACK: 17.0000 g | PACK | Freq: Two times a day (BID) | ORAL | Status: DC | PRN

## 2019-06-10 NOTE — Progress Notes (Signed)
[place] Hospital Day(s): 2.   Post op day(s): 2 Days Post-Op.   Interval History: Patient seen and examined, no acute events or new complaints overnight. Patient reports she is having 3 out of 10 pain this morning. Otherwise no complaints of fever, chills, nausea, or emesis. Tolerating a diet. + flatus still awaiting BM. Mobilized with family yesterday. Hospital wound vac in place now, no leaks/     Vital signs in last 24 hours: [min-max] current  Temp:  [98 F (36.7 C)-98.6 F (37 C)] 98 F (36.7 C) (08/06 0648) Pulse Rate:  [55-63] 63 (08/06 0648) Resp:  [16-18] 16 (08/06 0648) BP: (101-130)/(47-64) 130/64 (08/06 0648) SpO2:  [96 %-100 %] 97 % (08/06 0648)     Height: 5\' 2"  (157.5 cm) Weight: 67 kg BMI (Calculated): 27.01   Intake/Output last 2 shifts:  08/05 0701 - 08/06 0700 In: 55 [P.O.:960] Out: 2380 [Urine:2150; Drains:230]   Physical Exam:  Constitutional: alert, cooperative and no distress  Respiratory: breathing non-labored at rest  Gastrointestinal: soft, non-tender, and non-distended, JP drain in RLQ and LUQ with serosanguinous drainage Integumentary: Wound vac to midline wound, good seal, minimal drainage  Labs:  CBC Latest Ref Rng & Units 06/09/2019 06/08/2019 10/16/2018  WBC 4.0 - 10.5 K/uL 8.9 10.2 12.2(H)  Hemoglobin 12.0 - 15.0 g/dL 12.3 13.9 13.1  Hematocrit 36.0 - 46.0 % 38.3 44.0 41.6  Platelets 150 - 400 K/uL 242 263 318   CMP Latest Ref Rng & Units 06/09/2019 06/08/2019 10/16/2018  Glucose 70 - 99 mg/dL 151(H) - 123(H)  BUN 8 - 23 mg/dL 13 - 16  Creatinine 0.44 - 1.00 mg/dL 0.71 0.59 0.67  Sodium 135 - 145 mmol/L 139 - 136  Potassium 3.5 - 5.1 mmol/L 4.4 - 3.8  Chloride 98 - 111 mmol/L 106 - 103  CO2 22 - 32 mmol/L 25 - 25  Calcium 8.9 - 10.3 mg/dL 8.5(L) - 9.3  Total Protein 6.5 - 8.1 g/dL - - 7.2  Total Bilirubin 0.3 - 1.2 mg/dL - - 0.8  Alkaline Phos 38 - 126 U/L - - 67  AST 15 - 41 U/L - - 39  ALT 0 -  44 U/L - - 76(H)    Imaging studies: No new pertinent imaging studies   Assessment/Plan:  68 y.o. female with overall doing well 2 Days Post-Op s/p abdominal wall reconstruction for ventral hernia.   - Continue diet  - pain control prn; added gabapentin    - monitor abdominal examination  - monitor wound vac; will attempt to reinforce dressing   - medical management of comorbidities; holding antihypertensive  - mobilization encouraged    - Discharge planning: Hopefully home tomorrow  All of the above findings and recommendations were discussed with the patient, patient's family (son at bedside), and the medical team, and all of patient's and family's questions were answered to their expressed satisfaction.  -- Edison Simon, PA-C [facility] 06/10/2019, 9:38 AM [phone] M-F: 7am - 4pm

## 2019-06-10 NOTE — Care Management Important Message (Addendum)
Important Message  Patient Details  Name: Angela Blair MRN: [id number] Date of Birth: 1951/05/04   Medicare Important Message Given:  Yes  Initial Medicare IM given by Patient Access Associate on 06/09/2019 at 9:59am.  Still valid.    Dannette Barbara 06/10/2019, 11:48 AM

## 2019-06-10 NOTE — Progress Notes (Signed)
PA notified: The patient reports she did vomit one time. Amount was unmeasured.  Zofran was given.

## 2019-06-10 NOTE — Plan of Care (Signed)
Patient reported having 3 liquid stools. Vomited one time. Zofran given. Ambulated in the morning. KUB to be order if nausea and emesis continue. No falls. Voiding.  Problem: Education: Goal: Required Educational Video(s) Outcome: Progressing   Problem: Clinical Measurements: Goal: Ability to maintain clinical measurements within normal limits will improve Outcome: Progressing Goal: Postoperative complications will be avoided or minimized Outcome: Progressing   Problem: Skin Integrity: Goal: Demonstration of wound healing without infection will improve Outcome: Progressing

## 2019-06-11 LAB — C DIFFICILE QUICK SCREEN W PCR REFLEX
C Diff antigen: NEGATIVE
C Diff interpretation: NOT DETECTED
C Diff toxin: NEGATIVE

## 2019-06-11 MED ORDER — LOPERAMIDE HCL 2 MG PO CAPS
2.0000 mg | ORAL_CAPSULE | Freq: Two times a day (BID) | ORAL | Status: DC | PRN
  Administered 2019-06-11 – 2019-06-12 (×3): 2 mg via ORAL
  Filled 2019-06-11 (×3): qty 1

## 2019-06-11 MED ORDER — PSYLLIUM 95 % PO PACK
1.0000 | PACK | Freq: Every day | ORAL | Status: DC
  Administered 2019-06-11 – 2019-06-12 (×2): 1 via ORAL
  Filled 2019-06-11 (×4): qty 1

## 2019-06-11 NOTE — Clinical Social Work Note (Signed)
Ordered wound vac through [place]. Prevena has to pick up their vac before they can deliver the [place] or insurance will not cover it.  Dayton Scrape, [place]

## 2019-06-11 NOTE — Progress Notes (Signed)
[place] Hospital Day(s): 3.   Post op day(s): 3 Days Post-Op.   Interval History: Patient seen and examined, no acute events or new complaints overnight. Patient reports that she is feeling better this morning. She had nausea yesterday which resolved. She continues to endorse ,multiple watery loose stools. No complaints of fever, chills, abdominal pain, or emesis. She is tolerating a diet. Mobilizing well. Will attempt to reinforce dressing this morning and trial on Prevena vac again. No other acute issues   Vital signs in last 24 hours: [min-max] current  Temp:  [97.7 F (36.5 C)-98.6 F (37 C)] 98.3 F (36.8 C) (08/07 0934) Pulse Rate:  [53-73] 62 (08/07 0934) Resp:  [16-18] 18 (08/07 0537) BP: (101-134)/(44-59) 134/50 (08/07 0934) SpO2:  [97 %-99 %] 99 % (08/07 0934)     Height: 5\' 2"  (157.5 cm) Weight: 67 kg BMI (Calculated): 27.01   Intake/Output last 2 shifts:  08/06 0701 - 08/07 0700 In: 120 [P.O.:120] Out: 85 [Drains:85]   Physical Exam:  Constitutional: alert, cooperative and no distress  Respiratory: breathing non-labored at rest  Gastrointestinal: soft, non-tender, and non-distended, JP drain in RLQ and LUQ with serosanguinous drainage Integumentary: Wound vac to midline wound, good seal with hospital wound vac, minimal drainage  Labs:  CBC Latest Ref Rng & Units 06/09/2019 06/08/2019 10/16/2018  WBC 4.0 - 10.5 K/uL 8.9 10.2 12.2(H)  Hemoglobin 12.0 - 15.0 g/dL 12.3 13.9 13.1  Hematocrit 36.0 - 46.0 % 38.3 44.0 41.6  Platelets 150 - 400 K/uL 242 263 318   CMP Latest Ref Rng & Units 06/09/2019 06/08/2019 10/16/2018  Glucose 70 - 99 mg/dL 151(H) - 123(H)  BUN 8 - 23 mg/dL 13 - 16  Creatinine 0.44 - 1.00 mg/dL 0.71 0.59 0.67  Sodium 135 - 145 mmol/L 139 - 136  Potassium 3.5 - 5.1 mmol/L 4.4 - 3.8  Chloride 98 - 111 mmol/L 106 - 103  CO2 22 - 32 mmol/L 25 - 25  Calcium 8.9 - 10.3 mg/dL 8.5(L) - 9.3  Total Protein 6.5 - 8.1  g/dL - - 7.2  Total Bilirubin 0.3 - 1.2 mg/dL - - 0.8  Alkaline Phos 38 - 126 U/L - - 67  AST 15 - 41 U/L - - 39  ALT 0 - 44 U/L - - 76(H)     Imaging studies: No new pertinent imaging studies   Assessment/Plan:  68 y.o. female with multiple episode of watery diarrhea and issue with prevena wound vac seal but otherwise doing well 3 Days Post-Op s/p abdominal wall reconstruction for ventral hernia.   - Okay for soft diet  - Wound vac dressing reinforced at bedside. Will trial on prevena vac again, if issues persist will need to get alternative vac option    - Will check c diff given watery diarrhea although seems less likely as not receiving any ABx   - monitor abdominal examination             - medical management of comorbidities; holding antihypertensive             - mobilization encouraged    - Discharge planning; pending c diff, hopefully home in 24-48 hours once wound vac issues sorted out  All of the above findings and recommendations were discussed with the patient, and the medical team, and all of patient's questions were answered to her expressed satisfaction.  -- Edison Simon, PA-C [facility] 06/11/2019, 10:28 AM [phone] M-F: 7am -  4pm

## 2019-06-11 NOTE — Progress Notes (Signed)
Pt stated that she had three loose stools last night along with three loose stools yesterday evening. Primary nurse paged and spoke to Dr. Hampton Abbot. Orders received to check stool for Cdiff. Primaryu nurse to continue to monitor.

## 2019-06-12 NOTE — Plan of Care (Addendum)
Patient doing well today.  Wound vac to the abdomen is intact but no output.  JP drains have had minimal output.  Patient has ambulated twice today 1 lap around the nurses station.  She has had 3 loose BMs today, Imodium was given.  No significant changes.  Talked to Case management about the wound vac and they are still trying to work out how to get her a home vac.

## 2019-06-12 NOTE — Progress Notes (Signed)
Angela Blair is a patient of Dr. Corlis Leak, seen on rounds today.  She underwent a complex abdominal wall repair placement of a Prevena wound VAC on 06/08/19.  She has been recovering fairly well, but there have been issues with the suction aspect of the VAC.  She is currently on a hospital provided pump which is holding good suction.  We are awaiting pickup of the old pump and delivery of a Medela pump for the patient to be able to go home.    Today, she says that she has had some nausea.  She did not vomit but is not interested in eating.  She says that she is able to drink water and other liquids.  She has had no significant increase in output from her drains nor further episodes of diarrhea.  Past Medical History:  Diagnosis Date  . Complication of anesthesia    after bowel surgery pt states she was "talking out of her head"  . Hypercholesterolemia   . Hypertension    mild, does not want to take medications  . PONV (postoperative nausea and vomiting)    Past Surgical History:  Procedure Laterality Date  . ABDOMINAL HYSTERECTOMY    . APPENDECTOMY    . COLONOSCOPY  2013  . LAPAROTOMY N/A 05/05/2018   Procedure: EXPLORATORY LAPAROTOMY with small bowel resection, removal of foreign body;  Surgeon: Jules Husbands, MD;  Location: [facility] ORS;  Service: General;  Laterality: N/A;  photo taken of foreign body and placed on chart foreign body disposed of in sharps container  . VENTRAL HERNIA REPAIR N/A 06/08/2019   Procedure: HERNIA REPAIR VENTRAL ADULT, OPEN, ABDOMINAL WALL RECONSTRUCTION;  Surgeon: Jules Husbands, MD;  Location: [facility] ORS;  Service: General;  Laterality: N/A;   Family History  Problem Relation Age of Onset  . Breast cancer Neg Hx    Social History   Tobacco Use  . Smoking status: Never Smoker  . Smokeless tobacco: Never Used  Substance Use Topics  . Alcohol use: Never    Frequency: Never  . Drug use: Never   Current Meds  Medication Sig  . cholecalciferol (VITAMIN D3)  25 MCG (1000 UT) tablet Take 1,000 Units by mouth daily.  Marland Kitchen ezetimibe (ZETIA) 10 MG tablet Take 10 mg by mouth daily.   Marland Kitchen losartan (COZAAR) 50 MG tablet Take 50 mg by mouth daily.   Allergies  Allergen Reactions  . Statins Other (See Comments)    Intolerance - arthralgia     I/O last 3 completed shifts: In: -  Out: 70 [Drains:70] No intake/output data recorded.  Vitals:   06/11/19 2050 06/12/19 0524  BP: (!) 116/52 128/61  Pulse: (!) 57 (!) 57  Resp: 16 16  Temp: 98.2 F (36.8 C) 98.3 F (36.8 C)  SpO2: 98% 96%    Gen: Alert and oriented CV: Well perfused, regular rhythm Pulm: Normal work of breathing on room air Abd: Soft and nondistended.  There is good suction on the VAC today.  Both drains have serosanguineous drainage present.  Impression and plan: This is a 68 year old woman who is now postop day 4 from a complex abdominal wall repair.  She is having some nausea.  If she is unable to maintain her hydration status, will restart IV fluids.  C. difficile was negative and she has not had additional episodes of watery diarrhea.  We are still awaiting the VAC pump replacement.

## 2019-06-13 NOTE — Progress Notes (Signed)
Angela Blair is a patient of Dr. Corlis Leak, seen on rounds today.  She underwent a complex abdominal wall repair placement of a Prevena wound VAC on 06/08/19.  She has been recovering fairly well, but there have been issues with the suction aspect of the VAC.  She is currently on a hospital provided pump which is holding good suction.  We are awaiting pickup of the old pump and delivery of a Medela pump for the patient to be able to go home.    Yesterday, she had complained of nausea.  Today, she states that this is completely resolved.  She states that she feels it was the narcotic pain medication that was causing her nausea.  She continues to have some loose stools.  She has received Imodium.  She is tolerating a diet and has been out of bed, ambulating.  Past Medical History:  Diagnosis Date  . Complication of anesthesia    after bowel surgery pt states she was "talking out of her head"  . Hypercholesterolemia   . Hypertension    mild, does not want to take medications  . PONV (postoperative nausea and vomiting)    Past Surgical History:  Procedure Laterality Date  . ABDOMINAL HYSTERECTOMY    . APPENDECTOMY    . COLONOSCOPY  2013  . LAPAROTOMY N/A 05/05/2018   Procedure: EXPLORATORY LAPAROTOMY with small bowel resection, removal of foreign body;  Surgeon: Jules Husbands, MD;  Location: [facility] ORS;  Service: General;  Laterality: N/A;  photo taken of foreign body and placed on chart foreign body disposed of in sharps container  . VENTRAL HERNIA REPAIR N/A 06/08/2019   Procedure: HERNIA REPAIR VENTRAL ADULT, OPEN, ABDOMINAL WALL RECONSTRUCTION;  Surgeon: Jules Husbands, MD;  Location: [facility] ORS;  Service: General;  Laterality: N/A;   Family History  Problem Relation Age of Onset  . Breast cancer Neg Hx    Social History   Tobacco Use  . Smoking status: Never Smoker  . Smokeless tobacco: Never Used  Substance Use Topics  . Alcohol use: Never    Frequency: Never  . Drug use: Never    Current Meds  Medication Sig  . cholecalciferol (VITAMIN D3) 25 MCG (1000 UT) tablet Take 1,000 Units by mouth daily.  Marland Kitchen ezetimibe (ZETIA) 10 MG tablet Take 10 mg by mouth daily.   Marland Kitchen losartan (COZAAR) 50 MG tablet Take 50 mg by mouth daily.   Allergies  Allergen Reactions  . Statins Other (See Comments)    Intolerance - arthralgia     I/O last 3 completed shifts: In: -  Out: 22.5 [Drains:22.5] No intake/output data recorded.  Vitals:   06/12/19 2039 06/13/19 0404  BP: (!) 145/70 (!) 153/63  Pulse: 63 71  Resp: 16 18  Temp: 98.7 F (37.1 C) 98.5 F (36.9 C)  SpO2: 99% 99%    Gen: Alert and oriented CV: Well perfused, regular rhythm Pulm: Normal work of breathing on room air Abd: Soft and nondistended.  There is good suction on the VAC today.  Both drains have serosanguineous drainage present.  Impression and plan: This is a 68 year old woman who is now postop day 5 from a complex abdominal wall repair.  She is otherwise ready for discharge with the exception of still not having the appropriate home VAC device.  Hopefully this can be resolved by tomorrow and she will be able to go home.

## 2019-06-13 NOTE — Progress Notes (Signed)
I talked to [place], the [employer] rep about the Prevena vac.  He talked me through hooking the vac back to the patient and trial to see if it would hold the seal.  It started to work then started alarming leak.  The only other recommendation Tommy gave was to check the seal at the patient's abdominal fold.  I tried to reseal the area close to the JP drain and reinforce it there but with no luck.  I talked to Dr. Celine Ahr and told her about not being able to get a seal and about case management not being able to get a different vac.  I also talked to the patient and she wanted to just stay until it was taken care of.  I told Dr.Cannon all of this and she said they could decide what to do with it tomorrow.

## 2019-06-13 NOTE — Progress Notes (Signed)
Patient lost both of her IV's.  I talked to Dr. Celine Ahr and she said it was ok to leave the IV's out.

## 2019-06-13 NOTE — TOC Transition Note (Signed)
Transition of Care [facility]) - CM/SW Discharge Note   Patient Details  Name: Angela Blair MRN: [id number] Date of Birth: [birth year]  [facility] of [facility] CM/SW Contact:  Latanya Maudlin, RN Phone Number: 06/13/2019, 1:14 PM   Clinical Narrative:  TOC consulted to assist with wound vac. Patient had prevena vac in place post operatively but apparently earlier in the week it had lost its seal and was unable to function. [employer] rep, Konrad Dolores was able to come to the bedside and place larger [employer] VAC with exisiting dressing. Patient is medically cleared for discharge. RNCM spoke with both Linus Orn and [place] with [employer]. Per them the issue is that due to the location and shape of the wound there is difficulty obtaining a seal for the VAC thus not allowing it to suction. THe larger hospital VAC has worked but in [facility] opinion this is because it is less sensitive to leaks than a prevena therefor less likely to alarm. Bedside RN does report that larger VAC is showing some leakage but not enough to alarm. Tommy [employer] rep was able to speak with bedside RN and they are working to apply it successfully. If the prevena cannot be successfully applied we CANNOT order a portable [employer] vac because per Tommy it did not meet sizer requirements to get approved through insurance. We have also attempted to switch from prevena/[employer] to [facility]. However, since patient has [employer] VAC on a switch would cause the patient to be billed twice. It is not a worthwhile switch to medela because its VAC would likely have the same issues with the seal. If medical team is unsuccessful with prevena application the options are to transition to a wet to dry dressing and send the patient home or keep her on the [employer] VAC until no longer indicated.       Barriers to Discharge: No Barriers Identified   Patient Goals and CMS Choice        Discharge Placement                       Discharge Plan and Services                DME Arranged:  Vac DME Agency: [employer] Date DME Agency Contacted: 06/13/19 Time DME Agency Contacted: [phone] Representative spoke with at DME Agency: Cecile Hearing            Social Determinants of Health ([place]) Interventions     Readmission Risk Interventions No flowsheet data found.

## 2019-06-14 MED ORDER — OXYCODONE HCL 5 MG PO TABS: 5.0000 mg | ORAL_TABLET | ORAL | 0 refills | Status: DC | PRN

## 2019-06-14 NOTE — Discharge Summary (Signed)
[facility] SURGICAL DISCHARGE SUMMARY  Patient ID: Angela Blair MRN: [id number] DOB/AGE: October 30, 1951 68 y.o.  Admit date: 06/08/2019 Discharge date: 06/14/2019  Discharge Diagnoses Patient Active Problem List   Diagnosis Date Noted  . S/P repair of ventral hernia 06/08/2019  . Bowel perforation ([place]) 05/05/2018  . Acute abdomen   . Essential hypertension 04/06/2018  . Diabetes mellitus without complication ([place]) 86/76/7209  . Pure hypercholesterolemia 08/08/2014    Consultants None  Procedures 06/08/2019 1. Abdominal wall reconstruction with bilateral myocutaneous flaps using a TAR release and a medium weight macro porus polypropylene mesh measuring 30 x 30 cm,  2. Incisional hernia repair 3. Wound vac Placement 31 x 2 cm ( Prevena plus)  HPI: Angela Blair is a 68 y.o. female with a history of small bowel perforation secondary to foreign body and developed subsequent ventral hernia. She presents to [facility] on 08/[facility] for scheduled ventral hernia repair and abdominal wall reconstruction with Dr Dahlia Byes.   Hospital Course: Informed consent was obtained and documented, and patient underwent uneventful ventral hernia repair and abdominal wall reconstruction (Dr Dahlia Byes, 06/08/2019).  Post-operatively, the patient did well. She had some issues with ileus which resolved in a few days and her subsequent diarrhea was managed with imodium after c diff was excluded. The biggest issue post-operatively was failure of wound vac management system. Multiple alternatives were tired including reinforcing the dressing and changing vac without improvement. U.timately the wound vac was removed on day of discharge as it had already been 6 days.  Advancement of patient's diet and ambulation were well-tolerated. The remainder of patient's hospital course was essentially unremarkable, and discharge planning was initiated accordingly with patient safely able to be discharged home with  appropriate discharge instructions, pain control, and outpatient follow-up after all of her and her family's questions were answered to their expressed satisfaction.   Discharge Condition: Good   Physical Examination:  Constitutional: well appearing female, NAD Pulmonary: Normal effort, No respiratory distress Gastrointestinal: Soft, non-tender, non-distended, no rebound or guarding. JP drains in RLQ and LUQ with serosanguinous output Skin: Midline laparotomy incision is healing well with staples, no erythema or drainage.    Allergies as of 06/14/2019      Reactions   Statins Other (See Comments)   Intolerance - arthralgia      Medication List    TAKE these medications   cholecalciferol 25 MCG (1000 UT) tablet Commonly known as: VITAMIN D3 Take 1,000 Units by mouth daily.   ezetimibe 10 MG tablet Commonly known as: ZETIA Take 10 mg by mouth daily.   losartan 50 MG tablet Commonly known as: COZAAR Take 50 mg by mouth daily.   oxyCODONE 5 MG immediate release tablet Commonly known as: Oxy IR/ROXICODONE Take 1 tablet (5 mg total) by mouth every 4 (four) hours as needed for severe pain or breakthrough pain.        Follow-up Information    [place], [place], MD Follow up on 06/16/2019.   Specialty: General Surgery Why: s/p ventral hernia repair abdominal wall reconstruction Contact information: [address] [place] [address] [phone]            Time spent on discharge management including discussion of hospital course, clinical condition, outpatient instructions, prescriptions, and follow up with the patient and members of the medical team: >30 minutes  -- Edison Simon , PA-C [facility]  06/14/2019, 2:25 PM [phone] M-F: 7am - 4pm

## 2019-06-14 NOTE — Progress Notes (Signed)
06/14/2019  Subjective: Patient is 6 Days Post-Op s/p abdominal wall reconstruction with bilateral TAR with mesh.  No acute events.  Patient reports over the weekend she had one episode of nausea.  She is having flatus but did not have a bowel movement yesterday.  She had been having diarrhea previously last week.  Reports some spasms at the incision area.  Prevena vac has not been working well despite of multiple trials and instead had to be connected to a regular wound vac machine.  Vital signs: Temp:  [98.5 F (36.9 C)-98.9 F (37.2 C)] 98.5 F (36.9 C) (08/10 0525) Pulse Rate:  [62-68] 68 (08/10 0525) Resp:  [16-18] 18 (08/10 0525) BP: (138-150)/(61-68) 150/68 (08/10 0525) SpO2:  [96 %-100 %] 100 % (08/10 0525)   Intake/Output: 08/09 0701 - 08/10 0700 In: 0  Out: 480 [Urine:400; Drains:80] Last BM Date: 06/12/19  Physical Exam: Constitutional: No acute distress Abdomen:  Soft, non-distended, with appropriate tenderness to palpation.  Prevena vac in place with dressing connected to regular wound vac.  JP drains with serosanguinous fluid bilaterally.  Labs:  No results for input(s): WBC, HGB, HCT, PLT in the last 72 hours. No results for input(s): NA, K, CL, CO2, GLUCOSE, BUN, CREATININE, CALCIUM in the last 72 hours.  Invalid input(s): MAGNESIUM No results for input(s): LABPROT, INR in the last 72 hours.  Imaging: No results found.  Assessment/Plan: This is a 68 y.o. female s/p abdominal wall reconstruction  --Since Prevena vac has been having issues and she's almost a week out from surgery, will d/c vac today.  Midline incision under the vac was clean, dry, with staples in place. --Will d/c imodium and star Miralax in case she may be having some constipation. --Encourage ambulation and soft diet --Possible d/c home today if she's doing well.   Melvyn Neth, [facility]

## 2019-06-14 NOTE — Care Management Important Message (Signed)
Important Message  Patient Details  Name: Angela Blair MRN: [id number] Date of Birth: [birth year]   Medicare Important Message Given:  Yes     Juliann Pulse [place] 06/14/2019, 2:10 PM

## 2019-06-14 NOTE — TOC Transition Note (Signed)
Transition of Care [facility]) - CM/SW Discharge Note   Patient Details  Name: ISABELA NARDELLI MRN: [id number] Date of Birth: 1951/03/29  Transition of Care [facility]) CM/SW Contact:  Candie Chroman, LCSW Phone Number: 06/14/2019, 2:43 PM   Clinical Narrative: Patient has orders to discharge home today. Per PA, patient has no home health needs. No further concerns. CSW signing off.   Final next level of care: Home/Self Care Barriers to Discharge: Barriers Resolved   Patient Goals and CMS Choice        Discharge Placement                    Patient and family notified of of transfer: 06/14/19  Discharge Plan and Services                DME Arranged: Vac DME Agency: [employer] Date DME Agency Contacted: 06/13/19 Time DME Agency Contacted: [id number] Representative spoke with at DME Agency: Cecile Hearing            Social Determinants of Health ([place]) Interventions     Readmission Risk Interventions No flowsheet data found.

## 2019-06-14 NOTE — Progress Notes (Signed)
Discharge instructions reviewed with patient including followup visits, drain care and new medications.  Understanding was verbalized and all questions were answered.  Patient discharged home via wheelchair in stable condition escorted by nursing staff.

## 2019-06-16 ENCOUNTER — Encounter: Payer: Self-pay | Admitting: Surgery

## 2019-06-16 ENCOUNTER — Other Ambulatory Visit: Payer: Self-pay

## 2019-06-16 ENCOUNTER — Ambulatory Visit (INDEPENDENT_AMBULATORY_CARE_PROVIDER_SITE_OTHER): Payer: PPO | Admitting: Surgery

## 2019-06-16 VITALS — BP 132/76 | HR 71 | Temp 97.7°F | Ht 62.0 in | Wt 141.4 lb

## 2019-06-16 DIAGNOSIS — Z09 Encounter for follow-up examination after completed treatment for conditions other than malignant neoplasm: Secondary | ICD-10-CM

## 2019-06-16 NOTE — Patient Instructions (Signed)
Return in one week.  

## 2019-06-17 ENCOUNTER — Encounter: Payer: Self-pay | Admitting: Surgery

## 2019-06-17 NOTE — Progress Notes (Signed)
S/p abd wall reconstruction Doing well AVSS  No fevers or chills Ambulating Having pain from Left Drain ( retrorectus) Drain output less 25cc each Taking Po and Having BM  PE NAD Abd: soft, incisions c/d/i, no infection, no peritonitis, no seromas.. Serous fluids from drain. Both JP removed. Pt did experience pain but felt better afterwards.  A/p Doing well, some pain not unexpected, I anticipate it will improve after the drains has been removed F/W next week for staple removal

## 2019-06-23 ENCOUNTER — Encounter: Payer: Self-pay | Admitting: Surgery

## 2019-06-23 ENCOUNTER — Ambulatory Visit (INDEPENDENT_AMBULATORY_CARE_PROVIDER_SITE_OTHER): Payer: PPO | Admitting: Surgery

## 2019-06-23 ENCOUNTER — Other Ambulatory Visit: Payer: Self-pay

## 2019-06-23 VITALS — BP 146/84 | HR 61 | Temp 97.7°F | Ht 61.0 in | Wt 141.0 lb

## 2019-06-23 DIAGNOSIS — Z09 Encounter for follow-up examination after completed treatment for conditions other than malignant neoplasm: Secondary | ICD-10-CM

## 2019-06-23 MED ORDER — AMOXICILLIN-POT CLAVULANATE 875-125 MG PO TABS: 1.0000 | ORAL_TABLET | Freq: Two times a day (BID) | ORAL | 0 refills | Status: AC

## 2019-06-23 NOTE — Patient Instructions (Signed)
You may shower, do not submerge the area.  The steri strips will start to fall off in 1-2 week.  Follow up here in 3 weeks.

## 2019-06-24 NOTE — Progress Notes (Signed)
S/p abd wall reconstruction. Doing very well Significant improvement No fevers or chills + PO  PE NAD Abd: soft,no recurrence, staples removed. Inferior portion w some erythema. No abscess  A/P Doing very well Do some antibioitcs for induration One final wound check 3 weeks

## 2019-06-25 ENCOUNTER — Encounter: Payer: Self-pay | Admitting: Surgery

## 2019-07-04 IMAGING — MG MM DIGITAL SCREENING BILAT W/ TOMO W/ CAD
6 of 10 series · 6 of 30 positions shown · non-contrast
Comparison: Previous exam(s).

CLINICAL DATA: Screening.

EXAM:
DIGITAL SCREENING BILATERAL MAMMOGRAM WITH TOMO AND CAD

[R MLO synth-2D (1 of 2)]
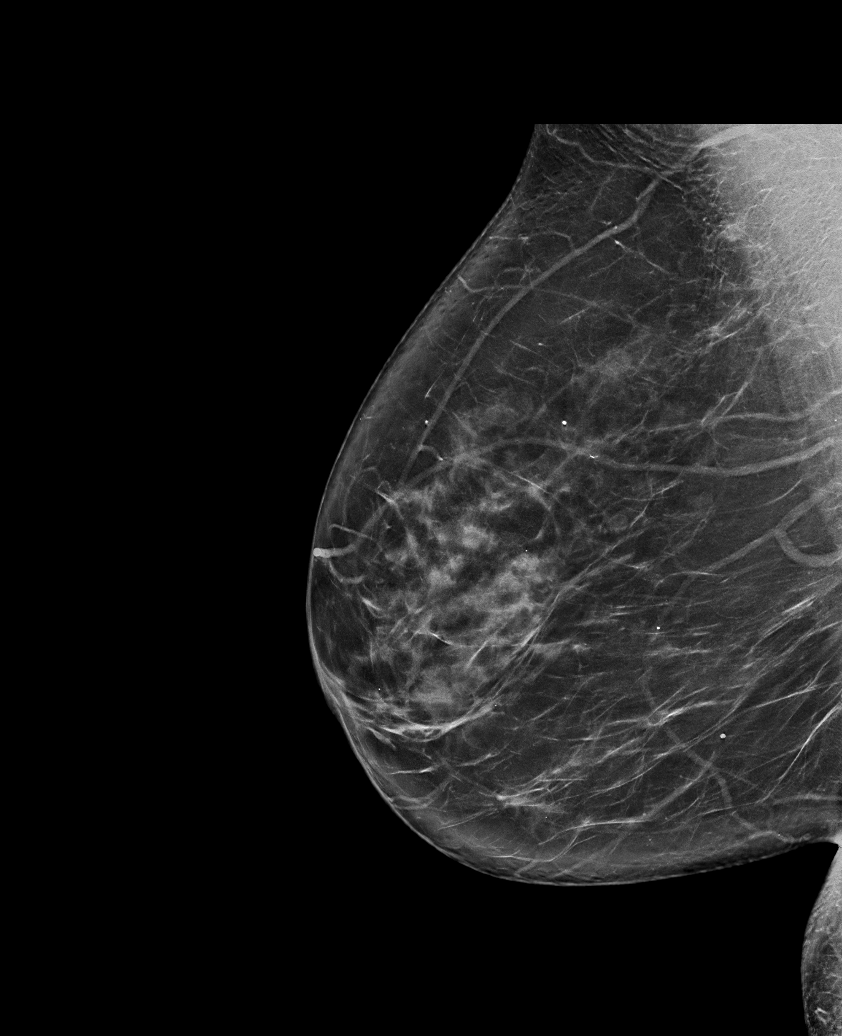

[R MLO synth-2D (2 of 2)]
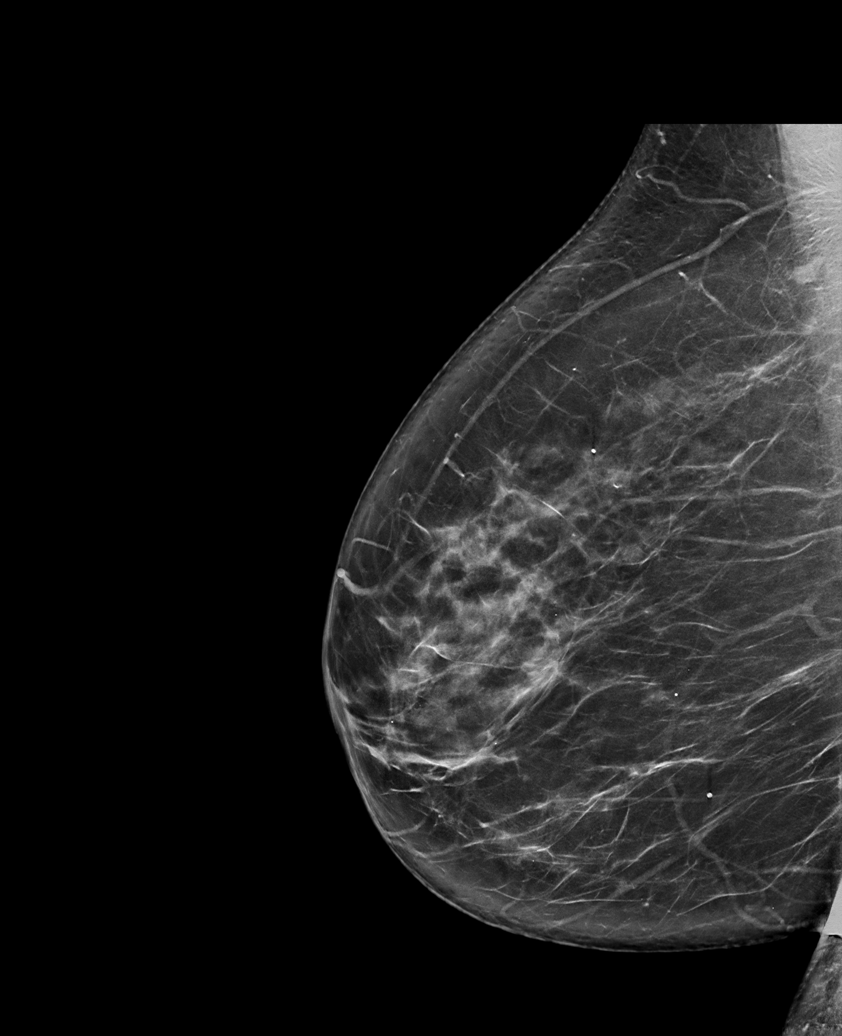

[L MLO synth-2D]
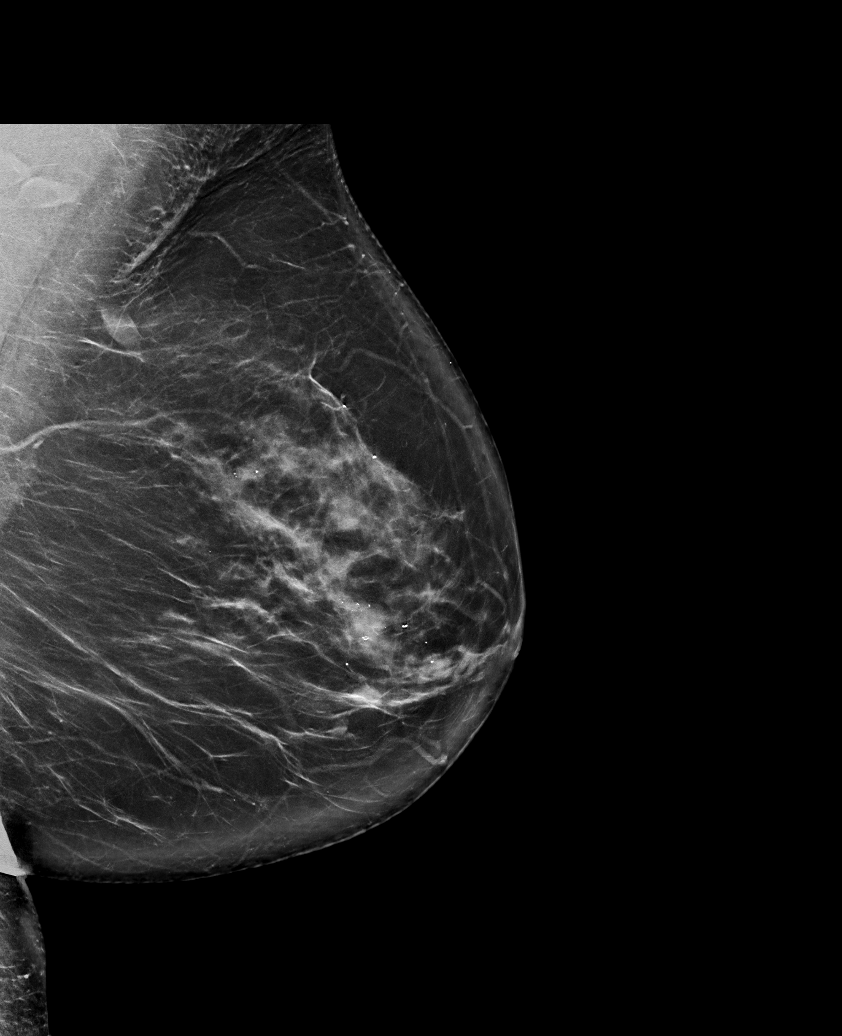

[R CC synth-2D]
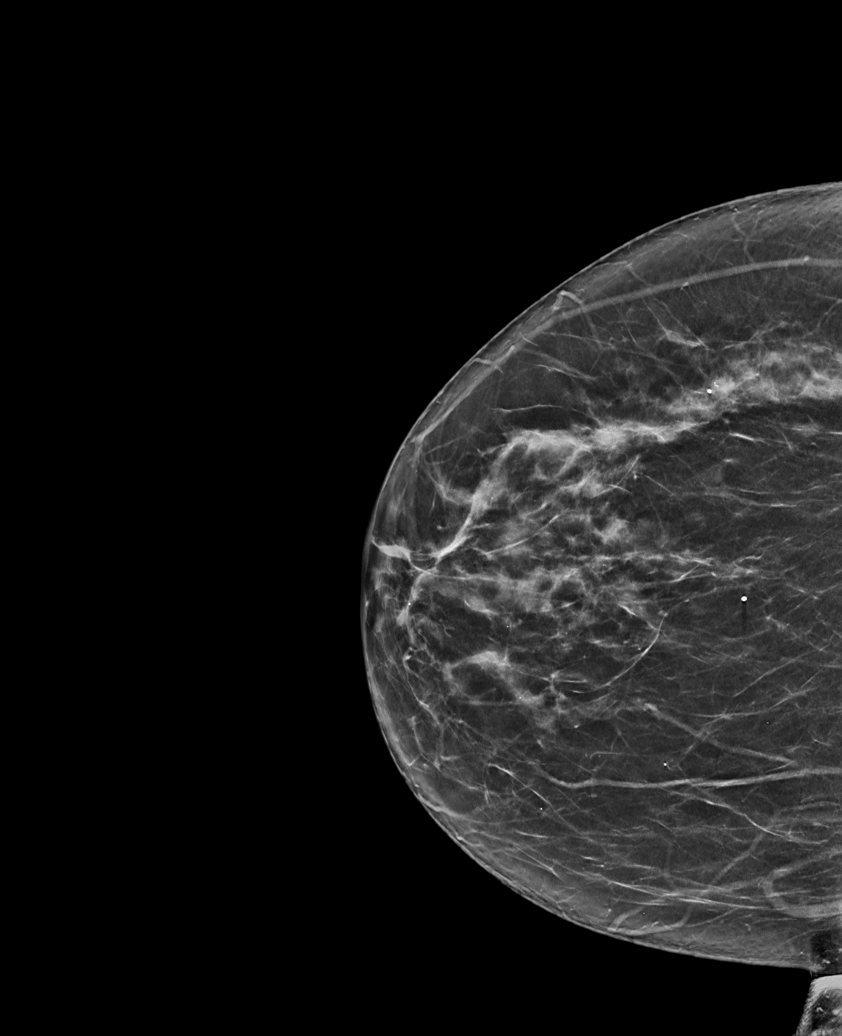

[L CC synth-2D]
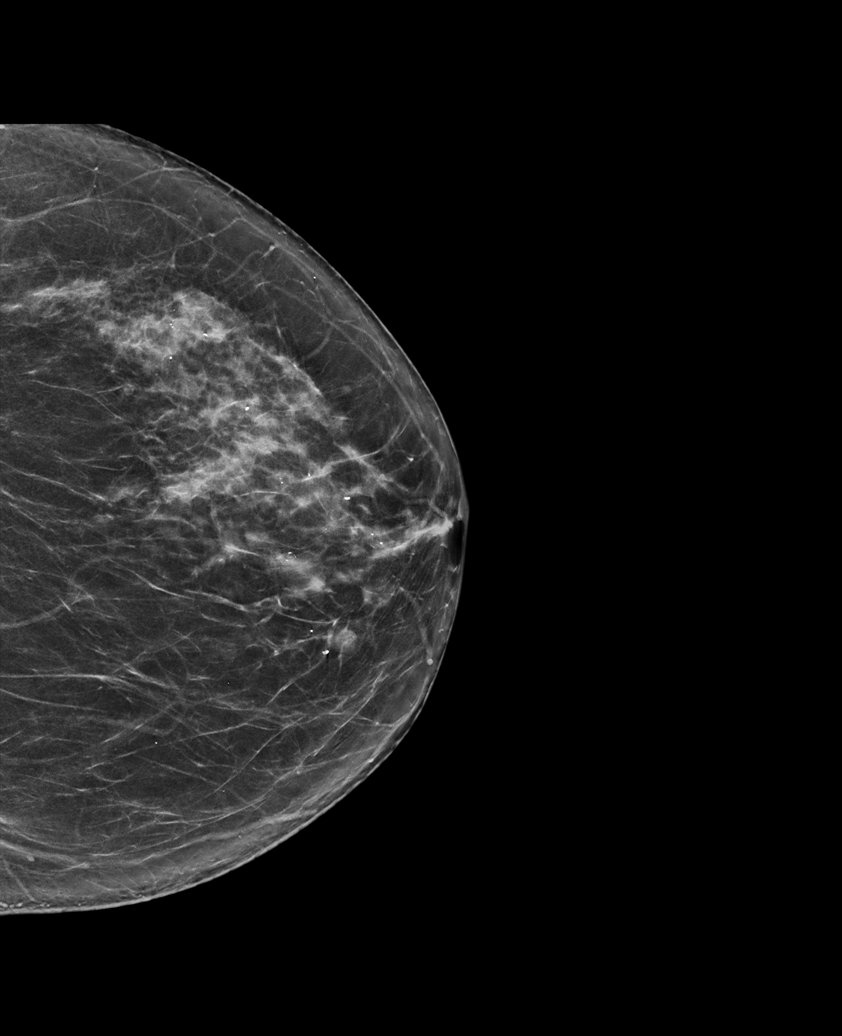

[L CC tomo · tomo slice 37/72.0]
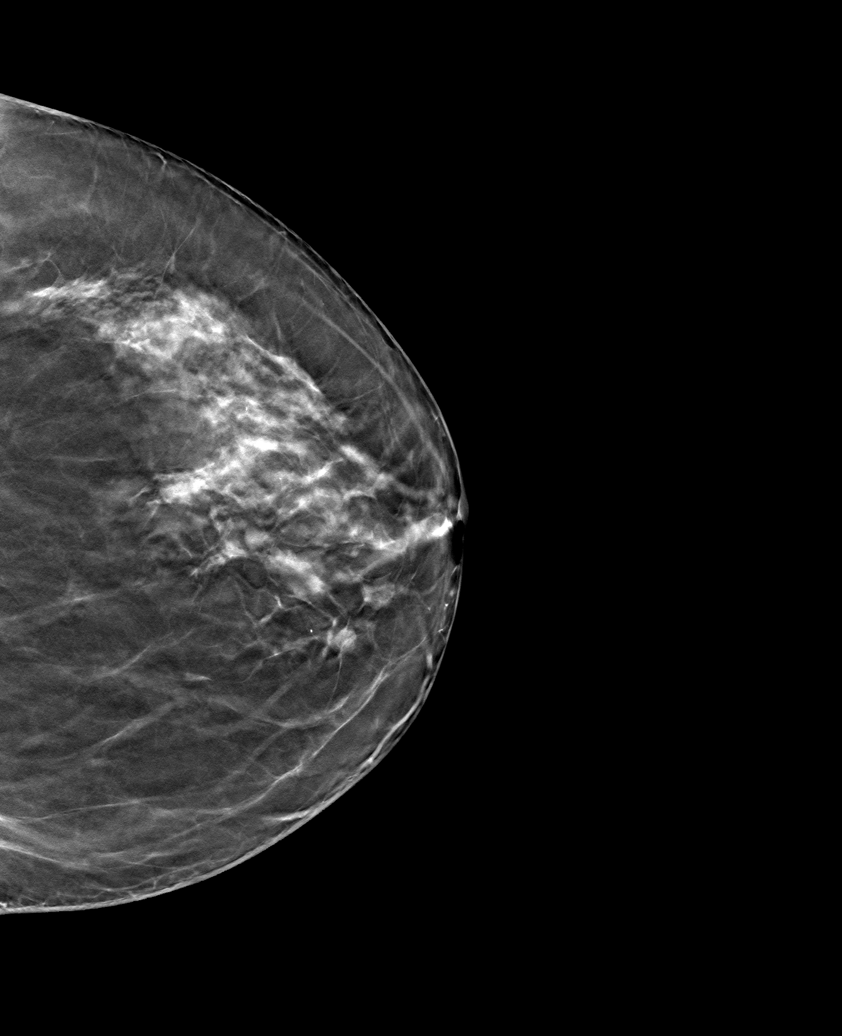

[6 of 30 positions shown; findings below may reference images not displayed]

ACR Breast Density Category c: The breast tissue is heterogeneously
dense, which may obscure small masses.
FINDINGS: There are no findings suspicious for malignancy. Images were
processed with CAD.
IMPRESSION: No mammographic evidence of malignancy. A result letter of this
screening mammogram will be mailed directly to the patient.

RECOMMENDATION:
Screening mammogram in one year. (Code:[id number])

BI-RADS CATEGORY  1: Negative.

## 2019-07-05 DIAGNOSIS — Z9889 Other specified postprocedural states: Secondary | ICD-10-CM | POA: Diagnosis not present

## 2019-07-05 DIAGNOSIS — Z8719 Personal history of other diseases of the digestive system: Secondary | ICD-10-CM | POA: Diagnosis not present

## 2019-07-14 ENCOUNTER — Encounter: Payer: Self-pay | Admitting: Surgery

## 2019-07-14 ENCOUNTER — Other Ambulatory Visit: Payer: Self-pay

## 2019-07-14 ENCOUNTER — Ambulatory Visit (INDEPENDENT_AMBULATORY_CARE_PROVIDER_SITE_OTHER): Payer: PPO | Admitting: Surgery

## 2019-07-14 VITALS — BP 138/77 | HR 61 | Temp 97.9°F | Ht 61.0 in | Wt 142.0 lb

## 2019-07-14 DIAGNOSIS — Z09 Encounter for follow-up examination after completed treatment for conditions other than malignant neoplasm: Secondary | ICD-10-CM

## 2019-07-14 NOTE — Patient Instructions (Signed)
   Follow-up with our office as needed.  Please call and ask to speak with a nurse if you develop questions or concerns.  

## 2019-07-15 ENCOUNTER — Encounter: Payer: Self-pay | Admitting: Surgery

## 2019-07-17 NOTE — Progress Notes (Signed)
S/p ad wall reconstruction Doing very well No complaints  PE NAD Abd: soft, nt, incisions healed, no infection no recurrence  A/P Doing great  F/U prn

## 2019-08-10 DIAGNOSIS — E1169 Type 2 diabetes mellitus with other specified complication: Secondary | ICD-10-CM | POA: Diagnosis not present

## 2019-08-10 DIAGNOSIS — E782 Mixed hyperlipidemia: Secondary | ICD-10-CM | POA: Diagnosis not present

## 2019-08-10 DIAGNOSIS — I10 Essential (primary) hypertension: Secondary | ICD-10-CM | POA: Diagnosis not present

## 2019-08-10 DIAGNOSIS — E785 Hyperlipidemia, unspecified: Secondary | ICD-10-CM | POA: Diagnosis not present

## 2019-08-18 DIAGNOSIS — E1169 Type 2 diabetes mellitus with other specified complication: Secondary | ICD-10-CM | POA: Diagnosis not present

## 2019-08-18 DIAGNOSIS — I10 Essential (primary) hypertension: Secondary | ICD-10-CM | POA: Diagnosis not present

## 2019-08-18 DIAGNOSIS — E782 Mixed hyperlipidemia: Secondary | ICD-10-CM | POA: Diagnosis not present

## 2019-08-18 DIAGNOSIS — E785 Hyperlipidemia, unspecified: Secondary | ICD-10-CM | POA: Diagnosis not present

## 2019-10-22 DIAGNOSIS — Z20828 Contact with and (suspected) exposure to other viral communicable diseases: Secondary | ICD-10-CM | POA: Diagnosis not present

## 2019-10-22 DIAGNOSIS — R05 Cough: Secondary | ICD-10-CM | POA: Diagnosis not present

## 2019-11-11 DIAGNOSIS — M79675 Pain in left toe(s): Secondary | ICD-10-CM | POA: Diagnosis not present

## 2019-12-21 DIAGNOSIS — E785 Hyperlipidemia, unspecified: Secondary | ICD-10-CM | POA: Diagnosis not present

## 2019-12-21 DIAGNOSIS — E782 Mixed hyperlipidemia: Secondary | ICD-10-CM | POA: Diagnosis not present

## 2019-12-21 DIAGNOSIS — E1169 Type 2 diabetes mellitus with other specified complication: Secondary | ICD-10-CM | POA: Diagnosis not present

## 2019-12-28 DIAGNOSIS — E785 Hyperlipidemia, unspecified: Secondary | ICD-10-CM | POA: Diagnosis not present

## 2019-12-28 DIAGNOSIS — E1169 Type 2 diabetes mellitus with other specified complication: Secondary | ICD-10-CM | POA: Diagnosis not present

## 2019-12-28 DIAGNOSIS — I10 Essential (primary) hypertension: Secondary | ICD-10-CM | POA: Diagnosis not present

## 2019-12-28 DIAGNOSIS — E782 Mixed hyperlipidemia: Secondary | ICD-10-CM | POA: Diagnosis not present

## 2019-12-28 DIAGNOSIS — M19042 Primary osteoarthritis, left hand: Secondary | ICD-10-CM | POA: Diagnosis not present

## 2019-12-28 DIAGNOSIS — M19041 Primary osteoarthritis, right hand: Secondary | ICD-10-CM | POA: Diagnosis not present

## 2020-03-07 ENCOUNTER — Ambulatory Visit
Admission: RE | Admit: 2020-03-07 | Discharge: 2020-03-07 | Disposition: A | Discharge status: Home / Self Care | Payer: PPO | Source: Ambulatory Visit | Attending: Family Medicine | Admitting: Family Medicine

## 2020-03-07 ENCOUNTER — Other Ambulatory Visit: Payer: Self-pay | Admitting: Family Medicine

## 2020-03-07 ENCOUNTER — Other Ambulatory Visit (HOSPITAL_COMMUNITY): Payer: Self-pay | Admitting: Family Medicine

## 2020-03-07 ENCOUNTER — Other Ambulatory Visit: Payer: Self-pay

## 2020-03-07 DIAGNOSIS — R109 Unspecified abdominal pain: Secondary | ICD-10-CM | POA: Insufficient documentation

## 2020-03-07 DIAGNOSIS — R1031 Right lower quadrant pain: Secondary | ICD-10-CM | POA: Diagnosis not present

## 2020-03-07 DIAGNOSIS — R10A1 Flank pain, right side: Secondary | ICD-10-CM

## 2020-03-07 DIAGNOSIS — R350 Frequency of micturition: Secondary | ICD-10-CM

## 2020-03-07 DIAGNOSIS — K5730 Diverticulosis of large intestine without perforation or abscess without bleeding: Secondary | ICD-10-CM | POA: Diagnosis not present

## 2020-04-04 DIAGNOSIS — M545 Low back pain: Secondary | ICD-10-CM | POA: Diagnosis not present

## 2020-04-24 ENCOUNTER — Other Ambulatory Visit: Payer: Self-pay | Admitting: Family Medicine

## 2020-04-24 DIAGNOSIS — Z1231 Encounter for screening mammogram for malignant neoplasm of breast: Secondary | ICD-10-CM

## 2020-04-27 ENCOUNTER — Ambulatory Visit
Admission: RE | Admit: 2020-04-27 | Discharge: 2020-04-27 | Disposition: A | Discharge status: Home / Self Care | Payer: PPO | Source: Ambulatory Visit | Attending: Family Medicine | Admitting: Family Medicine

## 2020-04-27 DIAGNOSIS — Z1231 Encounter for screening mammogram for malignant neoplasm of breast: Secondary | ICD-10-CM | POA: Diagnosis not present

## 2020-05-03 DIAGNOSIS — E782 Mixed hyperlipidemia: Secondary | ICD-10-CM | POA: Diagnosis not present

## 2020-05-03 DIAGNOSIS — E1169 Type 2 diabetes mellitus with other specified complication: Secondary | ICD-10-CM | POA: Diagnosis not present

## 2020-05-03 DIAGNOSIS — E785 Hyperlipidemia, unspecified: Secondary | ICD-10-CM | POA: Diagnosis not present

## 2020-05-03 DIAGNOSIS — I10 Essential (primary) hypertension: Secondary | ICD-10-CM | POA: Diagnosis not present

## 2020-05-10 DIAGNOSIS — Z1159 Encounter for screening for other viral diseases: Secondary | ICD-10-CM | POA: Diagnosis not present

## 2020-05-10 DIAGNOSIS — Z0000 Encounter for general adult medical examination without abnormal findings: Secondary | ICD-10-CM | POA: Diagnosis not present

## 2020-05-10 DIAGNOSIS — E1169 Type 2 diabetes mellitus with other specified complication: Secondary | ICD-10-CM | POA: Diagnosis not present

## 2020-05-10 DIAGNOSIS — E782 Mixed hyperlipidemia: Secondary | ICD-10-CM | POA: Diagnosis not present

## 2020-05-10 DIAGNOSIS — I10 Essential (primary) hypertension: Secondary | ICD-10-CM | POA: Diagnosis not present

## 2020-05-10 DIAGNOSIS — E785 Hyperlipidemia, unspecified: Secondary | ICD-10-CM | POA: Diagnosis not present

## 2020-05-10 DIAGNOSIS — Z9189 Other specified personal risk factors, not elsewhere classified: Secondary | ICD-10-CM | POA: Diagnosis not present

## 2020-09-13 DIAGNOSIS — R300 Dysuria: Secondary | ICD-10-CM | POA: Diagnosis not present

## 2020-09-13 DIAGNOSIS — A499 Bacterial infection, unspecified: Secondary | ICD-10-CM | POA: Diagnosis not present

## 2020-09-13 DIAGNOSIS — N390 Urinary tract infection, site not specified: Secondary | ICD-10-CM | POA: Diagnosis not present

## 2021-01-24 DIAGNOSIS — E1169 Type 2 diabetes mellitus with other specified complication: Secondary | ICD-10-CM | POA: Diagnosis not present

## 2021-01-24 DIAGNOSIS — E785 Hyperlipidemia, unspecified: Secondary | ICD-10-CM | POA: Diagnosis not present

## 2021-01-24 DIAGNOSIS — Z1159 Encounter for screening for other viral diseases: Secondary | ICD-10-CM | POA: Diagnosis not present

## 2021-01-24 DIAGNOSIS — E782 Mixed hyperlipidemia: Secondary | ICD-10-CM | POA: Diagnosis not present

## 2021-01-31 DIAGNOSIS — I10 Essential (primary) hypertension: Secondary | ICD-10-CM | POA: Diagnosis not present

## 2021-01-31 DIAGNOSIS — J069 Acute upper respiratory infection, unspecified: Secondary | ICD-10-CM | POA: Diagnosis not present

## 2021-01-31 DIAGNOSIS — E782 Mixed hyperlipidemia: Secondary | ICD-10-CM | POA: Diagnosis not present

## 2021-01-31 DIAGNOSIS — E1169 Type 2 diabetes mellitus with other specified complication: Secondary | ICD-10-CM | POA: Diagnosis not present

## 2021-01-31 DIAGNOSIS — E785 Hyperlipidemia, unspecified: Secondary | ICD-10-CM | POA: Diagnosis not present

## 2021-04-16 ENCOUNTER — Other Ambulatory Visit: Payer: Self-pay | Admitting: Family Medicine

## 2021-04-16 DIAGNOSIS — Z1231 Encounter for screening mammogram for malignant neoplasm of breast: Secondary | ICD-10-CM

## 2021-04-20 DIAGNOSIS — R399 Unspecified symptoms and signs involving the genitourinary system: Secondary | ICD-10-CM | POA: Diagnosis not present

## 2021-04-20 DIAGNOSIS — N390 Urinary tract infection, site not specified: Secondary | ICD-10-CM | POA: Diagnosis not present

## 2021-04-27 DIAGNOSIS — E1169 Type 2 diabetes mellitus with other specified complication: Secondary | ICD-10-CM | POA: Diagnosis not present

## 2021-04-27 DIAGNOSIS — E782 Mixed hyperlipidemia: Secondary | ICD-10-CM | POA: Diagnosis not present

## 2021-04-27 DIAGNOSIS — E785 Hyperlipidemia, unspecified: Secondary | ICD-10-CM | POA: Diagnosis not present

## 2021-05-01 ENCOUNTER — Ambulatory Visit
Admission: RE | Admit: 2021-05-01 | Discharge: 2021-05-01 | Disposition: A | Discharge status: Home / Self Care | Payer: PPO | Source: Ambulatory Visit | Attending: Family Medicine | Admitting: Family Medicine

## 2021-05-01 ENCOUNTER — Other Ambulatory Visit: Payer: Self-pay

## 2021-05-01 DIAGNOSIS — Z1231 Encounter for screening mammogram for malignant neoplasm of breast: Secondary | ICD-10-CM | POA: Insufficient documentation

## 2021-05-04 DIAGNOSIS — E1169 Type 2 diabetes mellitus with other specified complication: Secondary | ICD-10-CM | POA: Diagnosis not present

## 2021-05-04 DIAGNOSIS — I10 Essential (primary) hypertension: Secondary | ICD-10-CM | POA: Diagnosis not present

## 2021-05-04 DIAGNOSIS — E782 Mixed hyperlipidemia: Secondary | ICD-10-CM | POA: Diagnosis not present

## 2021-05-04 DIAGNOSIS — M19042 Primary osteoarthritis, left hand: Secondary | ICD-10-CM | POA: Diagnosis not present

## 2021-05-04 DIAGNOSIS — E785 Hyperlipidemia, unspecified: Secondary | ICD-10-CM | POA: Diagnosis not present

## 2021-05-04 DIAGNOSIS — M19041 Primary osteoarthritis, right hand: Secondary | ICD-10-CM | POA: Diagnosis not present

## 2021-08-26 DIAGNOSIS — Z03818 Encounter for observation for suspected exposure to other biological agents ruled out: Secondary | ICD-10-CM | POA: Diagnosis not present

## 2021-08-26 DIAGNOSIS — B9689 Other specified bacterial agents as the cause of diseases classified elsewhere: Secondary | ICD-10-CM | POA: Diagnosis not present

## 2021-08-26 DIAGNOSIS — J019 Acute sinusitis, unspecified: Secondary | ICD-10-CM | POA: Diagnosis not present

## 2021-10-03 DIAGNOSIS — E782 Mixed hyperlipidemia: Secondary | ICD-10-CM | POA: Diagnosis not present

## 2021-10-03 DIAGNOSIS — I10 Essential (primary) hypertension: Secondary | ICD-10-CM | POA: Diagnosis not present

## 2021-10-03 DIAGNOSIS — E785 Hyperlipidemia, unspecified: Secondary | ICD-10-CM | POA: Diagnosis not present

## 2021-10-03 DIAGNOSIS — E1169 Type 2 diabetes mellitus with other specified complication: Secondary | ICD-10-CM | POA: Diagnosis not present

## 2021-10-10 DIAGNOSIS — I10 Essential (primary) hypertension: Secondary | ICD-10-CM | POA: Diagnosis not present

## 2021-10-10 DIAGNOSIS — Z0000 Encounter for general adult medical examination without abnormal findings: Secondary | ICD-10-CM | POA: Diagnosis not present

## 2021-10-10 DIAGNOSIS — Z23 Encounter for immunization: Secondary | ICD-10-CM | POA: Diagnosis not present

## 2021-10-10 DIAGNOSIS — E785 Hyperlipidemia, unspecified: Secondary | ICD-10-CM | POA: Diagnosis not present

## 2021-10-10 DIAGNOSIS — E1169 Type 2 diabetes mellitus with other specified complication: Secondary | ICD-10-CM | POA: Diagnosis not present

## 2021-10-10 DIAGNOSIS — E782 Mixed hyperlipidemia: Secondary | ICD-10-CM | POA: Diagnosis not present

## 2021-11-29 DIAGNOSIS — Z20822 Contact with and (suspected) exposure to covid-19: Secondary | ICD-10-CM | POA: Diagnosis not present

## 2021-11-29 DIAGNOSIS — J019 Acute sinusitis, unspecified: Secondary | ICD-10-CM | POA: Diagnosis not present

## 2021-12-20 ENCOUNTER — Telehealth: Payer: Self-pay

## 2021-12-20 DIAGNOSIS — R1031 Right lower quadrant pain: Secondary | ICD-10-CM | POA: Diagnosis not present

## 2021-12-20 NOTE — Telephone Encounter (Signed)
LVM for pt to call the office to schedule an appt with Dr. Everlene Farrier.

## 2022-01-07 ENCOUNTER — Other Ambulatory Visit: Payer: Self-pay

## 2022-01-07 ENCOUNTER — Ambulatory Visit: Payer: PPO | Admitting: Surgery

## 2022-01-07 ENCOUNTER — Encounter: Payer: Self-pay | Admitting: Surgery

## 2022-01-07 VITALS — BP 148/74 | HR 68 | Temp 98.7°F | Ht 61.0 in | Wt 140.8 lb

## 2022-01-07 DIAGNOSIS — R1031 Right lower quadrant pain: Secondary | ICD-10-CM | POA: Diagnosis not present

## 2022-01-07 NOTE — Patient Instructions (Addendum)
Your CT is scheduled for 01/14/2022 @ 3:30 pm (arrive by 3 pm). Nothing to eat or drink 4 hours prior and please pick up your oral contrast between now and Friday 01/11/2022. ? ? ?If you have any concerns or questions, please feel free to call our office. See follow up appointment below.  ?

## 2022-01-09 NOTE — Progress Notes (Signed)
Outpatient Surgical Follow Up ? ?01/09/2022 ? ?Angela Blair is an 71 y.o. female.  ? ?Chief Complaint  ?Patient presents with  ? Follow-up  ?  Right inguinal hernia  ? ? ?HPI: This is a 71 year old female well-known to me with prior history of exploratory laparotomy for small bowel perforation caused by foreign body.  Chicken bone.  This was more than 3-1/2 years ago.  He then developed large ventral hernia requiring open repair with mesh and abdominal wall reconstruction in August 2020. ?Now comes in with a few week history of right lower quadrant and inguinal pain.  The pain is intermittent moderate intensity and sharp.  No specific alleviating or aggravating factors.  She is very active.  Husband had a recent orthopedic procedure and she needed to help him quite a bit. ?She denies any fevers any chills no nausea no vomiting. ?Recent CBC and CMP a couple weeks ago was completely normal. ? ?Past Medical History:  ?Diagnosis Date  ? Complication of anesthesia   ? after bowel surgery pt states she was "talking out of her head"  ? Hypercholesterolemia   ? Hypertension   ? mild, does not want to take medications  ? PONV (postoperative nausea and vomiting)   ? ? ?Past Surgical History:  ?Procedure Laterality Date  ? ABDOMINAL HYSTERECTOMY    ? APPENDECTOMY    ? COLONOSCOPY  2013  ? LAPAROTOMY N/A 05/05/2018  ? Procedure: EXPLORATORY LAPAROTOMY with small bowel resection, removal of foreign body;  Surgeon: Leafy Ro, MD;  Location: [facility] ORS;  Service: General;  Laterality: N/A;  photo taken of foreign body and placed on chart ?foreign body disposed of in sharps container  ? VENTRAL HERNIA REPAIR N/A 06/08/2019  ? Procedure: HERNIA REPAIR VENTRAL ADULT, OPEN, ABDOMINAL WALL RECONSTRUCTION;  Surgeon: Leafy Ro, MD;  Location: [facility] ORS;  Service: General;  Laterality: N/A;  ? ? ?Family History  ?Problem Relation Age of Onset  ? Breast cancer Neg Hx   ? ? ?Social History:  reports that she has never smoked. She  has never used smokeless tobacco. She reports that she does not drink alcohol and does not use drugs. ? ?Allergies:  ?Allergies  ?Allergen Reactions  ? Statins Other (See Comments)  ?  Intolerance - arthralgia  ? ? ?Medications reviewed. ? ? ? ?ROS ?Full ROS performed and is otherwise negative other than what is stated in HPI ? ? ?BP (!) 148/74   Pulse 68   Temp 98.7 ?F (37.1 ?C) (Oral)   Ht 5\' 1"  (1.549 m)   Wt 140 lb 12.8 oz (63.9 kg)   SpO2 98%   BMI 26.60 kg/m?  ? ?Physical Exam ?Vitals and nursing note reviewed. Exam conducted with a chaperone present.  ?Constitutional:   ?   Appearance: Normal appearance. She is normal weight.  ?Cardiovascular:  ?   Rate and Rhythm: Normal rate and regular rhythm.  ?   Heart sounds: No murmur heard. ?Pulmonary:  ?   Effort: Pulmonary effort is normal. No respiratory distress.  ?   Breath sounds: Normal breath sounds. No stridor. No wheezing.  ?Abdominal:  ?   General: Abdomen is flat. There is no distension.  ?   Palpations: Abdomen is soft. There is no mass.  ?   Tenderness: There is abdominal tenderness. There is no guarding or rebound.  ?   Hernia: No hernia is present.  ?   Comments: RLQ tenderness, no definitive inguinal r ventral hernias are  palpated.  Prior midline laparotomy scar without any hernias  ?Musculoskeletal:     ?   General: No swelling or tenderness. Normal range of motion.  ?   Cervical back: Normal range of motion and neck supple. No rigidity or tenderness.  ?Skin: ?   General: Skin is warm and dry.  ?   Capillary Refill: Capillary refill takes less than 2 seconds.  ?   Coloration: Skin is not jaundiced.  ?Neurological:  ?   General: No focal deficit present.  ?   Mental Status: She is alert and oriented to person, place, and time.  ?Psychiatric:     ?   Mood and Affect: Mood normal.     ?   Behavior: Behavior normal.     ?   Thought Content: Thought content normal.     ?   Judgment: Judgment normal.  ? ? ? ? ?Assessment/Plan: ?71 year old female  with prior history of bowel resection abdominal wall reconstruction now with right lower quadrant and inguinal pain.  On exam I cannot palpate a hernia.  Differential will also include kidney stone that apparently patient has had before.  We will start work-up with a CT scan of the abdomen and pelvis with and without contrast to also assess for potential kidney stone. ?He does not have an acute abdomen and does not need admission or surgical intervention at this time. ?Please note that I spent 40 minutes in this encounter including placing orders, reviewing her records, counseling the patient and performing appropriate documentation. ? ? ?Sterling Big, MD FACS ?General Surgeon  ?

## 2022-01-14 ENCOUNTER — Ambulatory Visit
Admission: RE | Admit: 2022-01-14 | Discharge: 2022-01-14 | Disposition: A | Discharge status: Home / Self Care | Payer: PPO | Source: Ambulatory Visit | Attending: Surgery | Admitting: Surgery

## 2022-01-14 ENCOUNTER — Other Ambulatory Visit: Payer: Self-pay

## 2022-01-14 DIAGNOSIS — K3189 Other diseases of stomach and duodenum: Secondary | ICD-10-CM | POA: Diagnosis not present

## 2022-01-14 DIAGNOSIS — N281 Cyst of kidney, acquired: Secondary | ICD-10-CM | POA: Diagnosis not present

## 2022-01-14 DIAGNOSIS — R1031 Right lower quadrant pain: Secondary | ICD-10-CM | POA: Insufficient documentation

## 2022-01-14 DIAGNOSIS — K5730 Diverticulosis of large intestine without perforation or abscess without bleeding: Secondary | ICD-10-CM | POA: Diagnosis not present

## 2022-01-14 MED ORDER — IOHEXOL 300 MG/ML  SOLN
100.0000 mL | Freq: Once | INTRAMUSCULAR | Status: AC | PRN
  Administered 2022-01-14: 100 mL via INTRAVENOUS

## 2022-01-16 ENCOUNTER — Telehealth: Payer: Self-pay

## 2022-01-16 NOTE — Telephone Encounter (Signed)
Spoke with patient and notified her of results.

## 2022-01-16 NOTE — Telephone Encounter (Signed)
Call to patient to let her know about her CT results. Per Dr Aleen Campi ,there's no evidence of hernia or infection or other possible issues on the right groin or right lower abdomen to explain the patient's symptoms.  Recommend keeping appointment with Dr. Everlene Farrier as scheduled so he can reevaluate her.  ?Message left for the patient to call back for her results.  ?

## 2022-01-28 ENCOUNTER — Other Ambulatory Visit: Payer: Self-pay

## 2022-01-28 ENCOUNTER — Encounter: Payer: Self-pay | Admitting: Surgery

## 2022-01-28 ENCOUNTER — Ambulatory Visit: Payer: PPO | Admitting: Surgery

## 2022-01-28 VITALS — BP 153/83 | HR 57 | Temp 98.4°F | Ht 62.0 in | Wt 141.8 lb

## 2022-01-28 DIAGNOSIS — R1031 Right lower quadrant pain: Secondary | ICD-10-CM

## 2022-01-28 NOTE — Patient Instructions (Signed)
If you have any concerns or questions, please feel free to call our office. Follow up as needed.  

## 2022-02-01 NOTE — Progress Notes (Signed)
Outpatient Surgical Follow Up ? ?02/01/2022 ? ?Angela Blair is an 71 y.o. female.  ? ?Chief Complaint  ?Patient presents with  ? Follow-up  ?  Left colonic diverticulosis   ? ? ?HPI: Angela Blair is a 71 year old female well-known to me with a history abdominal wall reconstruction for complex ventral hernia that developed after laparotomy  for perforated small bowel from foreign body. ?She came recently with right inguinal pain and right lower quadrant pain.  She states that her pain has significantly improved.  She is able to take p.o.  No issues with bowel movement.  She is much better.  She did have a CT scan of the abdomen and pelvis that I personally reviewed showing no evidence of acute intra-abdominal abnormalities specifically no evidence of any inguinal or abdominal wall hernias. ? ? ?Past Medical History:  ?Diagnosis Date  ? Complication of anesthesia   ? after bowel surgery pt states she was "talking out of her head"  ? Hypercholesterolemia   ? Hypertension   ? mild, does not want to take medications  ? PONV (postoperative nausea and vomiting)   ? ? ?Past Surgical History:  ?Procedure Laterality Date  ? ABDOMINAL HYSTERECTOMY    ? APPENDECTOMY    ? COLONOSCOPY  2013  ? LAPAROTOMY N/A 05/05/2018  ? Procedure: EXPLORATORY LAPAROTOMY with small bowel resection, removal of foreign body;  Surgeon: Leafy Ro, MD;  Location: [facility] ORS;  Service: General;  Laterality: N/A;  photo taken of foreign body and placed on chart ?foreign body disposed of in sharps container  ? VENTRAL HERNIA REPAIR N/A 06/08/2019  ? Procedure: HERNIA REPAIR VENTRAL ADULT, OPEN, ABDOMINAL WALL RECONSTRUCTION;  Surgeon: Leafy Ro, MD;  Location: [facility] ORS;  Service: General;  Laterality: N/A;  ? ? ?Family History  ?Problem Relation Age of Onset  ? Breast cancer Neg Hx   ? ? ?Social History:  reports that she has never smoked. She has never used smokeless tobacco. She reports that she does not drink alcohol and does not use  drugs. ? ?Allergies:  ?Allergies  ?Allergen Reactions  ? Statins Other (See Comments)  ?  Intolerance - arthralgia  ? ? ?Medications reviewed. ? ? ? ?ROS ?Full ROS performed and is otherwise negative other than what is stated in HPI ? ? ?BP (!) 153/83   Pulse (!) 57   Temp 98.4 ?F (36.9 ?C) (Oral)   Ht 5\' 2"  (1.575 m)   Wt 141 lb 12.8 oz (64.3 kg)   SpO2 96%   BMI 25.94 kg/m?  ? ?Physical Exam ?Vitals and nursing note reviewed.  ?Constitutional:   ?   Appearance: Normal appearance. She is normal weight.  ?Pulmonary:  ?   Effort: Pulmonary effort is normal. No respiratory distress.  ?   Breath sounds: No stridor.  ?Abdominal:  ?   General: Abdomen is flat. There is no distension.  ?   Palpations: Abdomen is soft. There is no mass.  ?   Tenderness: There is no abdominal tenderness. There is no guarding or rebound.  ?   Hernia: No hernia is present.  ?Musculoskeletal:     ?   General: No swelling. Normal range of motion.  ?Skin: ?   General: Skin is warm and dry.  ?   Capillary Refill: Capillary refill takes less than 2 seconds.  ?Neurological:  ?   General: No focal deficit present.  ?   Mental Status: She is alert and oriented to person, place, and  time.  ?Psychiatric:     ?   Mood and Affect: Mood normal.     ?   Behavior: Behavior normal.     ?   Thought Content: Thought content normal.     ?   Judgment: Judgment normal.  ? ?Assessment/Plan: ?Angela Blair is a 71 year old female with right inguinal pain that has significantly improved.  CT scan and clinically there is no evidence of abdominal wall hernias.  Discussed with the patient in detail this is likely related to musculoskeletal injury that have subsided.  No need for surgical intervention or any other further work-up.  Please note that I spent 30 minutes in this encounter including coordination of her care, placing orders, personally reviewing imaging studies, counseling the patient and coordinating her care. ? ? ? ?Sterling Big, MD FACS ?General Surgeon  ?

## 2022-04-08 DIAGNOSIS — I10 Essential (primary) hypertension: Secondary | ICD-10-CM | POA: Diagnosis not present

## 2022-04-08 DIAGNOSIS — E782 Mixed hyperlipidemia: Secondary | ICD-10-CM | POA: Diagnosis not present

## 2022-04-08 DIAGNOSIS — E1169 Type 2 diabetes mellitus with other specified complication: Secondary | ICD-10-CM | POA: Diagnosis not present

## 2022-04-08 DIAGNOSIS — E785 Hyperlipidemia, unspecified: Secondary | ICD-10-CM | POA: Diagnosis not present

## 2022-04-15 DIAGNOSIS — I10 Essential (primary) hypertension: Secondary | ICD-10-CM | POA: Diagnosis not present

## 2022-04-15 DIAGNOSIS — E1169 Type 2 diabetes mellitus with other specified complication: Secondary | ICD-10-CM | POA: Diagnosis not present

## 2022-04-15 DIAGNOSIS — E782 Mixed hyperlipidemia: Secondary | ICD-10-CM | POA: Diagnosis not present

## 2022-04-15 DIAGNOSIS — E785 Hyperlipidemia, unspecified: Secondary | ICD-10-CM | POA: Diagnosis not present

## 2022-05-20 ENCOUNTER — Other Ambulatory Visit: Payer: Self-pay | Admitting: Family Medicine

## 2022-05-20 DIAGNOSIS — Z1231 Encounter for screening mammogram for malignant neoplasm of breast: Secondary | ICD-10-CM

## 2022-06-11 ENCOUNTER — Ambulatory Visit
Admission: RE | Admit: 2022-06-11 | Discharge: 2022-06-11 | Disposition: A | Discharge status: Home / Self Care | Payer: PPO | Source: Ambulatory Visit | Attending: Family Medicine | Admitting: Family Medicine

## 2022-06-11 DIAGNOSIS — Z1231 Encounter for screening mammogram for malignant neoplasm of breast: Secondary | ICD-10-CM | POA: Diagnosis not present

## 2022-06-14 ENCOUNTER — Other Ambulatory Visit: Payer: Self-pay

## 2022-06-14 ENCOUNTER — Other Ambulatory Visit: Payer: Self-pay | Admitting: Internal Medicine

## 2022-06-14 DIAGNOSIS — N63 Unspecified lump in unspecified breast: Secondary | ICD-10-CM

## 2022-06-14 DIAGNOSIS — R928 Other abnormal and inconclusive findings on diagnostic imaging of breast: Secondary | ICD-10-CM

## 2022-06-30 DIAGNOSIS — B356 Tinea cruris: Secondary | ICD-10-CM | POA: Diagnosis not present

## 2022-07-03 DIAGNOSIS — H52223 Regular astigmatism, bilateral: Secondary | ICD-10-CM | POA: Diagnosis not present

## 2022-07-09 ENCOUNTER — Ambulatory Visit
Admission: RE | Admit: 2022-07-09 | Discharge: 2022-07-09 | Disposition: A | Discharge status: Home / Self Care | Payer: PPO | Source: Ambulatory Visit | Attending: Internal Medicine | Admitting: Internal Medicine

## 2022-07-09 DIAGNOSIS — N63 Unspecified lump in unspecified breast: Secondary | ICD-10-CM | POA: Diagnosis not present

## 2022-07-09 DIAGNOSIS — R928 Other abnormal and inconclusive findings on diagnostic imaging of breast: Secondary | ICD-10-CM

## 2022-07-09 DIAGNOSIS — N6001 Solitary cyst of right breast: Secondary | ICD-10-CM | POA: Diagnosis not present

## 2022-10-09 DIAGNOSIS — I10 Essential (primary) hypertension: Secondary | ICD-10-CM | POA: Diagnosis not present

## 2022-10-09 DIAGNOSIS — E1169 Type 2 diabetes mellitus with other specified complication: Secondary | ICD-10-CM | POA: Diagnosis not present

## 2022-10-09 DIAGNOSIS — E782 Mixed hyperlipidemia: Secondary | ICD-10-CM | POA: Diagnosis not present

## 2022-10-09 DIAGNOSIS — E785 Hyperlipidemia, unspecified: Secondary | ICD-10-CM | POA: Diagnosis not present

## 2022-10-16 DIAGNOSIS — Z0000 Encounter for general adult medical examination without abnormal findings: Secondary | ICD-10-CM | POA: Diagnosis not present

## 2022-10-16 DIAGNOSIS — L989 Disorder of the skin and subcutaneous tissue, unspecified: Secondary | ICD-10-CM | POA: Diagnosis not present

## 2022-10-16 DIAGNOSIS — I10 Essential (primary) hypertension: Secondary | ICD-10-CM | POA: Diagnosis not present

## 2022-10-16 DIAGNOSIS — E782 Mixed hyperlipidemia: Secondary | ICD-10-CM | POA: Diagnosis not present

## 2022-10-16 DIAGNOSIS — E1169 Type 2 diabetes mellitus with other specified complication: Secondary | ICD-10-CM | POA: Diagnosis not present

## 2022-10-16 DIAGNOSIS — L309 Dermatitis, unspecified: Secondary | ICD-10-CM | POA: Diagnosis not present

## 2022-10-16 DIAGNOSIS — E785 Hyperlipidemia, unspecified: Secondary | ICD-10-CM | POA: Diagnosis not present

## 2022-11-19 DIAGNOSIS — I10 Essential (primary) hypertension: Secondary | ICD-10-CM | POA: Diagnosis not present

## 2022-11-19 DIAGNOSIS — R21 Rash and other nonspecific skin eruption: Secondary | ICD-10-CM | POA: Diagnosis not present

## 2022-11-29 DIAGNOSIS — L821 Other seborrheic keratosis: Secondary | ICD-10-CM | POA: Diagnosis not present

## 2022-11-29 DIAGNOSIS — T22031A Burn of unspecified degree of right upper arm, initial encounter: Secondary | ICD-10-CM | POA: Diagnosis not present

## 2022-11-29 DIAGNOSIS — R21 Rash and other nonspecific skin eruption: Secondary | ICD-10-CM | POA: Diagnosis not present

## 2023-01-25 ENCOUNTER — Other Ambulatory Visit: Payer: Self-pay

## 2023-01-25 ENCOUNTER — Emergency Department
Admission: EM | Admit: 2023-01-25 | Discharge: 2023-01-25 | Disposition: A | Discharge status: Home / Self Care | Payer: PPO | Attending: Emergency Medicine | Admitting: Emergency Medicine

## 2023-01-25 DIAGNOSIS — R21 Rash and other nonspecific skin eruption: Secondary | ICD-10-CM | POA: Diagnosis not present

## 2023-01-25 DIAGNOSIS — I10 Essential (primary) hypertension: Secondary | ICD-10-CM | POA: Insufficient documentation

## 2023-01-25 DIAGNOSIS — M792 Neuralgia and neuritis, unspecified: Secondary | ICD-10-CM | POA: Diagnosis not present

## 2023-01-25 MED ORDER — LIDOCAINE 5 % EX OINT: 1.0000 | TOPICAL_OINTMENT | Freq: Every day | CUTANEOUS | 0 refills | Status: AC | PRN

## 2023-01-25 MED ORDER — CLONIDINE HCL 0.1 MG PO TABS
0.1000 mg | ORAL_TABLET | Freq: Once | ORAL | Status: AC
Start: 2023-01-25 — End: 2023-01-25
  Administered 2023-01-25: 0.1 mg via ORAL
  Filled 2023-01-25: qty 1

## 2023-01-25 MED ORDER — PREDNISONE 10 MG (21) PO TBPK: ORAL_TABLET | ORAL | 0 refills | Status: AC

## 2023-01-25 NOTE — ED Notes (Signed)
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

## 2023-01-25 NOTE — ED Triage Notes (Addendum)
Pt to ED from home for rash on inner thigh x 3 months that sometimes spreads to her vagina and buttocks. Pt denies any pain with the rash.  Pt has been seen several times for same and does not feel it is getting better. Pt is CAOx4, in no acute distress and ambulatory in triage. Pt has a cream she is rubbing on it with no relief.

## 2023-01-25 NOTE — ED Provider Notes (Signed)
[facility] Provider Note  Patient Contact: 8:33 PM (approximate)   History   Rash (Inner thigh x3 months)   HPI  Angela Blair is a 72 y.o. female with a history of hypertension and hypercholesterolemia, presents to the emergency department for a burning sensation that occasionally occurs on patient's inner thighs and sometimes along her labia.  Patient has not identified a rash and has never seen a rash in this area although her PCP has tried to use 2 different creams to help with the symptoms and patient states that they do not work.  She denies bowel or bladder incontinence or saddle anesthesia and has had no pain in her low back.  She denies dysuria, hematuria or increased urinary frequency.  She states the symptoms have occurred for a long time and she works long hours and only had time to be seen tonight.  No fever or chills at home.      Physical Exam   Triage Vital Signs: ED Triage Vitals  Enc Vitals Group     BP 01/25/23 1926 (!) 180/70     Pulse Rate 01/25/23 1926 63     Resp 01/25/23 1926 16     Temp 01/25/23 1926 98 F (36.7 C)     Temp Source 01/25/23 1926 Oral     SpO2 01/25/23 1926 98 %     Weight 01/25/23 1924 143 lb 4.8 oz (65 kg)     Height 01/25/23 1924 5\' 2"  (1.575 m)     Head Circumference --      Peak Flow --      Pain Score 01/25/23 1924 0     Pain Loc --      Pain Edu? --      Excl. in [place]? --     Most recent vital signs: Vitals:   01/25/23 1926  BP: (!) 180/70  Pulse: 63  Resp: 16  Temp: 98 F (36.7 C)  SpO2: 98%     General: Alert and in no acute distress. Eyes:  PERRL. EOMI. Head: No acute traumatic findings ENT:      Nose: No congestion/rhinnorhea.      Mouth/Throat: Mucous membranes are moist. Neck: No stridor. No cervical spine tenderness to palpation. Cardiovascular:  Good peripheral perfusion Respiratory: Normal respiratory effort without tachypnea or retractions. Lungs CTAB. Good air entry to  the bases with no decreased or absent breath sounds. Gastrointestinal: Bowel sounds 4 quadrants. Soft and nontender to palpation. No guarding or rigidity. No palpable masses. No distention. No CVA tenderness. Musculoskeletal: Full range of motion to all extremities.  Neurologic:  No gross focal neurologic deficits are appreciated.  Skin:   No rash noted Other:   ED Results / Procedures / Treatments   Labs (all labs ordered are listed, but only abnormal results are displayed) Labs Reviewed - No data to display      PROCEDURES:  Critical Care performed: No  Procedures   MEDICATIONS ORDERED IN ED: Medications  cloNIDine (CATAPRES) tablet 0.1 mg (has no administration in time range)     IMPRESSION / MDM / ASSESSMENT AND PLAN / ED COURSE  I reviewed the triage vital signs and the nursing notes.                              Assessment and plan Rash 72 year old female presents to the emergency department with a burning and hot sensation along her inner  thighs and occasionally along the labia.  Patient was hypertensive at triage but vital signs otherwise reassuring.  On exam, patient was alert and nontoxic-appearing with no rash visualized.  Differential diagnosis includes neuralgia versus atrophic vaginitis.  Will try a course of prednisone to address possible neuralgia with topical lidocaine.  Recommended return precautions to return to the emergency department if symptoms seem to be worsening or if patient has incontinence.  All patient questions were answered.      FINAL CLINICAL IMPRESSION(S) / ED DIAGNOSES   Final diagnoses:  Neuralgia     Rx / DC Orders   ED Discharge Orders          Ordered    lidocaine (XYLOCAINE) 5 % ointment  Daily PRN        01/25/23 2023    predniSONE (STERAPRED UNI-PAK 21 TAB) 10 MG (21) TBPK tablet        01/25/23 2023             Note:  This document was prepared using Dragon voice recognition software and may include  unintentional dictation errors.   Karren Cobble 01/25/23 2039    Lavonia Drafts, MD 01/27/23 1524

## 2023-01-25 NOTE — Discharge Instructions (Signed)
Take tapered Pred is known as directed. You can apply topical lidocaine for pain relief.

## 2023-01-29 DIAGNOSIS — I10 Essential (primary) hypertension: Secondary | ICD-10-CM | POA: Diagnosis not present

## 2023-02-03 ENCOUNTER — Telehealth: Payer: Self-pay | Admitting: *Deleted

## 2023-02-03 NOTE — Telephone Encounter (Signed)
        Patient  visited [facility] ed on 01/25/2023  for treatment    Telephone encounter attempt :  1st  A HIPAA compliant voice message was left requesting a return call.  Instructed patient to call back at [phone].  [place] [phone] [address] , [place] [address] Email : [email or web address]

## 2023-02-05 ENCOUNTER — Telehealth: Payer: Self-pay | Admitting: *Deleted

## 2023-02-05 NOTE — Telephone Encounter (Signed)
        Patient  visited [facility] on 01/25/2023  for treatment   Telephone encounter attempt :  2nd  A HIPAA compliant voice message was left requesting a return call.  Instructed patient to call back at [phone]. [place] [phone] [address] , [place] [address] Email : [email or web address]

## 2023-04-15 DIAGNOSIS — E782 Mixed hyperlipidemia: Secondary | ICD-10-CM | POA: Diagnosis not present

## 2023-04-15 DIAGNOSIS — E1169 Type 2 diabetes mellitus with other specified complication: Secondary | ICD-10-CM | POA: Diagnosis not present

## 2023-04-15 DIAGNOSIS — E785 Hyperlipidemia, unspecified: Secondary | ICD-10-CM | POA: Diagnosis not present

## 2023-04-22 DIAGNOSIS — E1169 Type 2 diabetes mellitus with other specified complication: Secondary | ICD-10-CM | POA: Diagnosis not present

## 2023-04-22 DIAGNOSIS — E785 Hyperlipidemia, unspecified: Secondary | ICD-10-CM | POA: Diagnosis not present

## 2023-04-22 DIAGNOSIS — I10 Essential (primary) hypertension: Secondary | ICD-10-CM | POA: Diagnosis not present

## 2023-04-22 DIAGNOSIS — E782 Mixed hyperlipidemia: Secondary | ICD-10-CM | POA: Diagnosis not present

## 2023-06-24 ENCOUNTER — Other Ambulatory Visit: Payer: Self-pay | Admitting: Family Medicine

## 2023-06-24 DIAGNOSIS — Z1231 Encounter for screening mammogram for malignant neoplasm of breast: Secondary | ICD-10-CM

## 2023-07-15 ENCOUNTER — Ambulatory Visit
Admission: RE | Admit: 2023-07-15 | Discharge: 2023-07-15 | Disposition: A | Discharge status: Home / Self Care | Payer: PPO | Source: Ambulatory Visit | Attending: Family Medicine | Admitting: Family Medicine

## 2023-07-15 DIAGNOSIS — Z1231 Encounter for screening mammogram for malignant neoplasm of breast: Secondary | ICD-10-CM | POA: Insufficient documentation

## 2023-07-18 ENCOUNTER — Other Ambulatory Visit: Payer: Self-pay | Admitting: Family Medicine

## 2023-07-18 DIAGNOSIS — N6489 Other specified disorders of breast: Secondary | ICD-10-CM

## 2023-07-18 DIAGNOSIS — N63 Unspecified lump in unspecified breast: Secondary | ICD-10-CM

## 2023-07-18 DIAGNOSIS — R928 Other abnormal and inconclusive findings on diagnostic imaging of breast: Secondary | ICD-10-CM

## 2023-07-22 DIAGNOSIS — I10 Essential (primary) hypertension: Secondary | ICD-10-CM | POA: Diagnosis not present

## 2023-07-22 DIAGNOSIS — E785 Hyperlipidemia, unspecified: Secondary | ICD-10-CM | POA: Diagnosis not present

## 2023-07-22 DIAGNOSIS — E782 Mixed hyperlipidemia: Secondary | ICD-10-CM | POA: Diagnosis not present

## 2023-07-22 DIAGNOSIS — E1169 Type 2 diabetes mellitus with other specified complication: Secondary | ICD-10-CM | POA: Diagnosis not present

## 2023-07-29 DIAGNOSIS — E1169 Type 2 diabetes mellitus with other specified complication: Secondary | ICD-10-CM | POA: Diagnosis not present

## 2023-07-29 DIAGNOSIS — I10 Essential (primary) hypertension: Secondary | ICD-10-CM | POA: Diagnosis not present

## 2023-07-29 DIAGNOSIS — E782 Mixed hyperlipidemia: Secondary | ICD-10-CM | POA: Diagnosis not present

## 2023-07-29 DIAGNOSIS — E785 Hyperlipidemia, unspecified: Secondary | ICD-10-CM | POA: Diagnosis not present

## 2023-07-30 ENCOUNTER — Ambulatory Visit
Admission: RE | Admit: 2023-07-30 | Discharge: 2023-07-30 | Disposition: A | Discharge status: Home / Self Care | Payer: PPO | Source: Ambulatory Visit | Attending: Family Medicine | Admitting: Family Medicine

## 2023-07-30 DIAGNOSIS — R928 Other abnormal and inconclusive findings on diagnostic imaging of breast: Secondary | ICD-10-CM | POA: Insufficient documentation

## 2023-07-30 DIAGNOSIS — N63 Unspecified lump in unspecified breast: Secondary | ICD-10-CM

## 2023-07-30 DIAGNOSIS — N6489 Other specified disorders of breast: Secondary | ICD-10-CM

## 2023-07-30 DIAGNOSIS — N6321 Unspecified lump in the left breast, upper outer quadrant: Secondary | ICD-10-CM | POA: Diagnosis not present

## 2023-07-30 DIAGNOSIS — R92323 Mammographic fibroglandular density, bilateral breasts: Secondary | ICD-10-CM | POA: Diagnosis not present

## 2023-07-30 DIAGNOSIS — N6001 Solitary cyst of right breast: Secondary | ICD-10-CM | POA: Diagnosis not present

## 2023-07-30 DIAGNOSIS — N6311 Unspecified lump in the right breast, upper outer quadrant: Secondary | ICD-10-CM | POA: Diagnosis not present

## 2023-08-04 ENCOUNTER — Other Ambulatory Visit: Payer: Self-pay | Admitting: Family Medicine

## 2023-08-04 DIAGNOSIS — N63 Unspecified lump in unspecified breast: Secondary | ICD-10-CM

## 2023-08-04 DIAGNOSIS — R928 Other abnormal and inconclusive findings on diagnostic imaging of breast: Secondary | ICD-10-CM

## 2023-08-13 ENCOUNTER — Other Ambulatory Visit: Payer: PPO

## 2023-08-19 ENCOUNTER — Ambulatory Visit
Admission: RE | Admit: 2023-08-19 | Discharge: 2023-08-19 | Disposition: A | Discharge status: Home / Self Care | Payer: PPO | Source: Ambulatory Visit | Attending: Family Medicine | Admitting: Family Medicine

## 2023-08-19 DIAGNOSIS — N6321 Unspecified lump in the left breast, upper outer quadrant: Secondary | ICD-10-CM | POA: Diagnosis not present

## 2023-08-19 DIAGNOSIS — R928 Other abnormal and inconclusive findings on diagnostic imaging of breast: Secondary | ICD-10-CM | POA: Diagnosis not present

## 2023-08-19 DIAGNOSIS — N63 Unspecified lump in unspecified breast: Secondary | ICD-10-CM

## 2023-08-19 DIAGNOSIS — N6032 Fibrosclerosis of left breast: Secondary | ICD-10-CM | POA: Insufficient documentation

## 2023-08-19 DIAGNOSIS — R920 Mammographic microcalcification found on diagnostic imaging of breast: Secondary | ICD-10-CM | POA: Diagnosis not present

## 2023-08-19 HISTORY — PX: BREAST BIOPSY: SHX20

## 2023-08-19 MED ORDER — LIDOCAINE HCL 1 % IJ SOLN
8.0000 mL | Freq: Once | INTRAMUSCULAR | Status: AC
  Administered 2023-08-19: 8 mL
  Filled 2023-08-19: qty 8

## 2023-08-20 LAB — SURGICAL PATHOLOGY

## 2023-09-12 DIAGNOSIS — M1811 Unilateral primary osteoarthritis of first carpometacarpal joint, right hand: Secondary | ICD-10-CM | POA: Diagnosis not present

## 2023-09-12 DIAGNOSIS — M6530 Trigger finger, unspecified finger: Secondary | ICD-10-CM | POA: Diagnosis not present

## 2023-09-12 DIAGNOSIS — Z23 Encounter for immunization: Secondary | ICD-10-CM | POA: Diagnosis not present

## 2023-09-12 DIAGNOSIS — M65311 Trigger thumb, right thumb: Secondary | ICD-10-CM | POA: Diagnosis not present

## 2023-09-18 DIAGNOSIS — M65311 Trigger thumb, right thumb: Secondary | ICD-10-CM | POA: Diagnosis not present

## 2023-11-17 ENCOUNTER — Emergency Department
Admission: EM | Admit: 2023-11-17 | Discharge: 2023-11-17 | Discharge status: Against Medical Advice | Payer: PPO | Source: Home / Self Care

## 2024-01-20 DIAGNOSIS — E782 Mixed hyperlipidemia: Secondary | ICD-10-CM | POA: Diagnosis not present

## 2024-01-20 DIAGNOSIS — I10 Essential (primary) hypertension: Secondary | ICD-10-CM | POA: Diagnosis not present

## 2024-01-20 DIAGNOSIS — E1169 Type 2 diabetes mellitus with other specified complication: Secondary | ICD-10-CM | POA: Diagnosis not present

## 2024-01-20 DIAGNOSIS — E785 Hyperlipidemia, unspecified: Secondary | ICD-10-CM | POA: Diagnosis not present

## 2024-02-17 DIAGNOSIS — I10 Essential (primary) hypertension: Secondary | ICD-10-CM | POA: Diagnosis not present

## 2024-02-17 DIAGNOSIS — E782 Mixed hyperlipidemia: Secondary | ICD-10-CM | POA: Diagnosis not present

## 2024-02-17 DIAGNOSIS — Z0000 Encounter for general adult medical examination without abnormal findings: Secondary | ICD-10-CM | POA: Diagnosis not present

## 2024-02-17 DIAGNOSIS — E785 Hyperlipidemia, unspecified: Secondary | ICD-10-CM | POA: Diagnosis not present

## 2024-02-17 DIAGNOSIS — T300 Burn of unspecified body region, unspecified degree: Secondary | ICD-10-CM | POA: Diagnosis not present

## 2024-02-17 DIAGNOSIS — E1169 Type 2 diabetes mellitus with other specified complication: Secondary | ICD-10-CM | POA: Diagnosis not present

## 2024-05-25 ENCOUNTER — Emergency Department

## 2024-05-25 ENCOUNTER — Other Ambulatory Visit: Payer: Self-pay

## 2024-05-25 ENCOUNTER — Emergency Department
Admission: EM | Admit: 2024-05-25 | Discharge: 2024-05-25 | Disposition: A | Discharge status: Home / Self Care | Attending: Emergency Medicine | Admitting: Emergency Medicine

## 2024-05-25 DIAGNOSIS — I10 Essential (primary) hypertension: Secondary | ICD-10-CM | POA: Insufficient documentation

## 2024-05-25 DIAGNOSIS — M25571 Pain in right ankle and joints of right foot: Secondary | ICD-10-CM | POA: Diagnosis not present

## 2024-05-25 DIAGNOSIS — M7731 Calcaneal spur, right foot: Secondary | ICD-10-CM | POA: Diagnosis not present

## 2024-05-25 DIAGNOSIS — M85871 Other specified disorders of bone density and structure, right ankle and foot: Secondary | ICD-10-CM | POA: Diagnosis not present

## 2024-05-25 MED ORDER — ACETAMINOPHEN 500 MG PO TABS
1000.0000 mg | ORAL_TABLET | Freq: Once | ORAL | Status: AC
  Administered 2024-05-25: 1000 mg via ORAL
  Filled 2024-05-25: qty 2

## 2024-05-25 NOTE — Discharge Instructions (Addendum)
 You were evaluated in the ED for right ankle pain.  Your x-rays are normal.  Your presentation is consistent with an ankle sprain.  You have been provided with a Ace wrap for compression that will help reduce swelling.  Please get plenty of rest.  Apply ice to the affected area to help with swelling.  You can alternate with a warm heating pad.  Ensure you elevate your leg above heart level as much as possible.  You have been provided with a walking boot.  Please use this walking boot when you are up and walking for 1 week.  Follow-up with your primary care provider.  If symptoms worsen return to ED for further evaluation.

## 2024-05-25 NOTE — ED Triage Notes (Signed)
 Pt to ED via POV from home. Pt ambulatory to triage. Pt reports was taking her kitty cat to the vet and twisted her right ankle turning. No blood thinners.

## 2024-05-25 NOTE — ED Provider Triage Note (Signed)
 Emergency Medicine Provider Triage Evaluation Note  Angela Blair , a 73 y.o. female  was evaluated in triage.  Pt complains of rolling her right ankle over her cat this morning.  Has since noticed swelling and pain localized to her right ankle.  Patient is ambulatory however there is pain with ambulation Review of Systems  Positive:  Negative:   Physical Exam  BP (!) 163/71   Pulse 69   Temp 98.8 F (37.1 C) (Oral)   Resp 18   Ht 5' 1 (1.549 m)   Wt 63.5 kg   SpO2 95%   BMI 26.45 kg/m  Gen:   Awake, no distress well-appearing Resp:  Normal effort  MSK:   Moves extremities without difficulty  Other:  No visible deformity to the right ankle.  Limited dorsi flexion secondary to pain.  Noted swelling to the anterior lateral aspect of ankle.  Neurovascular status intact all throughout.  Good capillary refills.  Medical Decision Making  Medically screening exam initiated at 4:13 PM.  Appropriate orders placed.  Angela Blair was informed that the remainder of the evaluation will be completed by another provider, this initial triage assessment does not replace that evaluation, and the importance of remaining in the ED until their evaluation is complete.    [place], Lafayette Dunlevy A, PA-C 05/25/24 1615

## 2024-05-25 NOTE — ED Notes (Signed)
 CAM boot applied to R ankle

## 2024-05-25 NOTE — ED Provider Notes (Signed)
 [facility] Emergency Department Provider Note     Event Date/Time   First MD Initiated Contact with Patient 05/25/24 1910     (approximate)   History   Ankle Pain   HPI  Angela Blair is Blair 73 y.o. female with Blair history of HTN, hypercholesterolemia presents to the ED for evaluation of right ankle pain.  Patient reports she twisted her ankle while taking her cat to the vet today.  Initially she did not feel pain however Blair couple hours later she noticed increasing pain and swelling to the right side of her ankle.  Patient is able to bear weight however endorses pain.    Physical Exam   Triage Vital Signs: ED Triage Vitals  Encounter Vitals Group     BP 05/25/24 1613 (!) 163/71     Girls Systolic BP Percentile --      Girls Diastolic BP Percentile --      Boys Systolic BP Percentile --      Boys Diastolic BP Percentile --      Pulse Rate 05/25/24 1611 69     Resp 05/25/24 1611 18     Temp 05/25/24 1611 98.8 F (37.1 C)     Temp Source 05/25/24 1611 Oral     SpO2 05/25/24 1611 95 %     Weight 05/25/24 1611 140 lb (63.5 kg)     Height 05/25/24 1611 5' 1 (1.549 m)     Head Circumference --      Peak Flow --      Pain Score 05/25/24 1611 8     Pain Loc --      Pain Education --      Exclude from Growth Chart --     Most recent vital signs: Vitals:   05/25/24 1613 05/25/24 1941  BP: (!) 163/71 (!) 161/72  Pulse:  68  Resp:  18  Temp:  98.7 F (37.1 C)  SpO2:  96%    General Awake, no distress.  CV:  Good peripheral perfusion.  RESP:  Normal effort.  ABD:  No distention.  Other:  No visible deformity to right ankle.  Limited dorsiflexion and plantarflexion secondary to pain.  Neurovascular status intact all throughout.  Good capillary refills.  Patient is ambulatory with antalgic gait favoring right ankle  ED Results / Procedures / Treatments   Labs (all labs ordered are listed, but only abnormal results are displayed) Labs  Reviewed - No data to display  RADIOLOGY  I personally viewed and evaluated these images as part of my medical decision making, as well as reviewing the written report by the radiologist.  ED Provider Interpretation: Noted soft tissue swelling of the ankle however no acute bony abnormality  DG Ankle Complete Right Result Date: 05/25/2024 CLINICAL DATA:  Injury.  Pain EXAM: RIGHT ANKLE - COMPLETE 3 VIEW COMPARISON:  None Available. FINDINGS: Small well-corticated Achilles calcaneal spur. Osteopenia. No fracture or dislocation. Preserved joint spaces. Soft tissue swelling about the ankle. IMPRESSION: Osteopenia with soft tissue swelling.  Achilles calcaneal spur. Electronically Signed   By: Ranell Bring M.D.   On: 05/25/2024 16:35    PROCEDURES:  Critical Care performed: No  Procedures   MEDICATIONS ORDERED IN ED: Medications  acetaminophen  (TYLENOL ) tablet 1,000 mg (1,000 mg Oral Given 05/25/24 1932)     IMPRESSION / MDM / ASSESSMENT AND PLAN / ED COURSE  I reviewed the triage vital signs and the nursing notes.  Clinical Course as of 05/25/24 2310  Tue May 25, 2024  1910 DG Ankle Complete Right IMPRESSION: Osteopenia with soft tissue swelling.  Achilles calcaneal spur.   [MH]    Clinical Course User Index [MH] Angela Monte A, PA-C    73 y.o. female presents to the emergency department for evaluation and treatment of acute right ankle pain. See HPI for further details.   Differential diagnosis includes, but is not limited to fracture, dislocation, sprain  Patient's presentation is most consistent with acute complicated illness / injury requiring diagnostic workup.  Patient is alert and oriented.  She is hemodynamically stable.  Physical exam findings are stated above.  X-ray reveals osteopenia with soft tissue swelling.  Offered cam boot however patient reports unsure if she will be able to drive home with the boot on her right foot.  Patient  placed in Ace wrap and provided cam boot to carry with her and use when she gets home.  She is in agreement with this plan.  RICE education at home discussed.  Patient stable and satisfied condition for discharge home.  ED precaution discussed.   FINAL CLINICAL IMPRESSION(S) / ED DIAGNOSES   Final diagnoses:  Acute right ankle pain   Rx / DC Orders   ED Discharge Orders     None        Note:  This document was prepared using Dragon voice recognition software and may include unintentional dictation errors.    Angela, Angela Hemmelgarn A, PA-C 05/25/24 2310    Jacolyn Pae, MD 05/25/24 [id number]

## 2024-06-27 DIAGNOSIS — Z03818 Encounter for observation for suspected exposure to other biological agents ruled out: Secondary | ICD-10-CM | POA: Diagnosis not present

## 2024-06-27 DIAGNOSIS — U071 COVID-19: Secondary | ICD-10-CM | POA: Diagnosis not present

## 2024-06-27 DIAGNOSIS — J069 Acute upper respiratory infection, unspecified: Secondary | ICD-10-CM | POA: Diagnosis not present

## 2024-07-13 ENCOUNTER — Other Ambulatory Visit: Payer: Self-pay | Admitting: Family Medicine

## 2024-07-13 DIAGNOSIS — Z1231 Encounter for screening mammogram for malignant neoplasm of breast: Secondary | ICD-10-CM

## 2024-08-04 ENCOUNTER — Ambulatory Visit
Admission: RE | Admit: 2024-08-04 | Discharge: 2024-08-04 | Disposition: A | Discharge status: Home / Self Care | Source: Ambulatory Visit | Attending: Family Medicine | Admitting: Family Medicine

## 2024-08-04 DIAGNOSIS — Z1231 Encounter for screening mammogram for malignant neoplasm of breast: Secondary | ICD-10-CM | POA: Diagnosis not present

## 2024-08-11 DIAGNOSIS — E785 Hyperlipidemia, unspecified: Secondary | ICD-10-CM | POA: Diagnosis not present

## 2024-08-11 DIAGNOSIS — E1169 Type 2 diabetes mellitus with other specified complication: Secondary | ICD-10-CM | POA: Diagnosis not present

## 2024-08-11 DIAGNOSIS — E782 Mixed hyperlipidemia: Secondary | ICD-10-CM | POA: Diagnosis not present

## 2024-08-11 DIAGNOSIS — I10 Essential (primary) hypertension: Secondary | ICD-10-CM | POA: Diagnosis not present

## 2024-08-18 DIAGNOSIS — M79605 Pain in left leg: Secondary | ICD-10-CM | POA: Diagnosis not present

## 2024-08-18 DIAGNOSIS — G720 Drug-induced myopathy: Secondary | ICD-10-CM | POA: Diagnosis not present

## 2024-08-18 DIAGNOSIS — T466X5A Adverse effect of antihyperlipidemic and antiarteriosclerotic drugs, initial encounter: Secondary | ICD-10-CM | POA: Diagnosis not present

## 2024-08-18 DIAGNOSIS — E785 Hyperlipidemia, unspecified: Secondary | ICD-10-CM | POA: Diagnosis not present

## 2024-08-18 DIAGNOSIS — E782 Mixed hyperlipidemia: Secondary | ICD-10-CM | POA: Diagnosis not present

## 2024-08-18 DIAGNOSIS — I10 Essential (primary) hypertension: Secondary | ICD-10-CM | POA: Diagnosis not present

## 2024-08-18 DIAGNOSIS — Z1211 Encounter for screening for malignant neoplasm of colon: Secondary | ICD-10-CM | POA: Diagnosis not present

## 2024-08-18 DIAGNOSIS — E1169 Type 2 diabetes mellitus with other specified complication: Secondary | ICD-10-CM | POA: Diagnosis not present

## 2024-08-18 DIAGNOSIS — R21 Rash and other nonspecific skin eruption: Secondary | ICD-10-CM | POA: Diagnosis not present

## 2024-09-27 ENCOUNTER — Emergency Department

## 2024-09-27 ENCOUNTER — Telehealth: Payer: Self-pay | Admitting: Neurosurgery

## 2024-09-27 ENCOUNTER — Other Ambulatory Visit: Payer: Self-pay

## 2024-09-27 ENCOUNTER — Encounter: Payer: Self-pay | Admitting: Emergency Medicine

## 2024-09-27 ENCOUNTER — Emergency Department
Admission: EM | Admit: 2024-09-27 | Discharge: 2024-09-27 | Disposition: A | Discharge status: Home / Self Care | Attending: Emergency Medicine | Admitting: Emergency Medicine

## 2024-09-27 DIAGNOSIS — R2681 Unsteadiness on feet: Secondary | ICD-10-CM | POA: Diagnosis not present

## 2024-09-27 DIAGNOSIS — I672 Cerebral atherosclerosis: Secondary | ICD-10-CM | POA: Diagnosis not present

## 2024-09-27 DIAGNOSIS — I10 Essential (primary) hypertension: Secondary | ICD-10-CM | POA: Diagnosis not present

## 2024-09-27 DIAGNOSIS — R42 Dizziness and giddiness: Secondary | ICD-10-CM

## 2024-09-27 DIAGNOSIS — G459 Transient cerebral ischemic attack, unspecified: Secondary | ICD-10-CM | POA: Diagnosis not present

## 2024-09-27 DIAGNOSIS — I6522 Occlusion and stenosis of left carotid artery: Secondary | ICD-10-CM | POA: Diagnosis not present

## 2024-09-27 DIAGNOSIS — I6502 Occlusion and stenosis of left vertebral artery: Secondary | ICD-10-CM | POA: Diagnosis not present

## 2024-09-27 LAB — BASIC METABOLIC PANEL WITH GFR
Anion gap: 8 (ref 5–15)
BUN: 13 mg/dL (ref 8–23)
CO2: 25 mmol/L (ref 22–32)
Calcium: 9.1 mg/dL (ref 8.9–10.3)
Chloride: 108 mmol/L (ref 98–111)
Creatinine, Ser: 0.58 mg/dL (ref 0.44–1.00)
GFR, Estimated: >60 mL/min (ref >60)
Glucose, Bld: 122 mg/dL — ABNORMAL HIGH (ref 70–99)
Potassium: 4.2 mmol/L (ref 3.5–5.1)
Sodium: 141 mmol/L (ref 135–145)

## 2024-09-27 LAB — CBC
HCT: 47 % — ABNORMAL HIGH (ref 36.0–46.0)
Hemoglobin: 15 g/dL (ref 12.0–15.0)
MCH: 28 pg (ref 26.0–34.0)
MCHC: 31.9 g/dL (ref 30.0–36.0)
MCV: 87.9 fL (ref 80.0–100.0)
Platelets: 278 K/uL (ref 150–400)
RBC: 5.35 MIL/uL — ABNORMAL HIGH (ref 3.87–5.11)
RDW: 13.6 % (ref 11.5–15.5)
WBC: 6.4 K/uL (ref 4.0–10.5)
nRBC: 0 % (ref 0.0–0.2)

## 2024-09-27 LAB — TROPONIN T, HIGH SENSITIVITY: Troponin T High Sensitivity: 15 ng/L (ref 0–19)

## 2024-09-27 MED ORDER — IOHEXOL 350 MG/ML SOLN
75.0000 mL | Freq: Once | INTRAVENOUS | Status: AC | PRN
  Administered 2024-09-27: 75 mL via INTRAVENOUS

## 2024-09-27 MED ORDER — MECLIZINE HCL 25 MG PO TABS: 25.0000 mg | ORAL_TABLET | Freq: Three times a day (TID) | ORAL | 0 refills | Status: AC | PRN

## 2024-09-27 NOTE — ED Notes (Signed)
 Assumed are of pt from [employer]. Introduced self to pt. Assisted pt to bathroom at this time. Call bell in reach. Pt has no further needs.

## 2024-09-27 NOTE — ED Triage Notes (Addendum)
 Pt arrives POV ambulatory to triage, gait steady, no acute distress noted c/o dizziness and htn. Pt reports going to bed around 2300 and states everytime she would reposition in bed, she became dizzy; so around 0300 this morning she got up and checked bp and it was 204/104, pt took losartan  100 mg around 0530 this morning. Denies n/v or sob.

## 2024-09-27 NOTE — Telephone Encounter (Signed)
 Please direct patient to call our [facility] location to schedule. Thank you

## 2024-09-27 NOTE — ED Notes (Signed)
 Pt given lunch box and water with provider Ok.

## 2024-09-27 NOTE — ED Provider Notes (Addendum)
 [facility] Provider Note    Event Date/Time   First MD Initiated Contact with Patient 09/27/24 0703     (approximate)   History   Dizziness and Hypertension   HPI  Angela Blair is a 73 y.o. female past medical history significant for hypertension, hyperlipidemia, presents to the emergency department with dizziness and hypertension.  Patient states she went to bed last night and her normal state of health.  States that she did all of her normal activities during the daytime.  Went to bed around 11:00.  States that she woke up around 3:00 in the morning and rolled over in bed and started having severe dizziness and feeling like the room was spinning.  States that every time she went to get up she was having significant difficulties with dizziness and with trouble with her balance.  States that he called her son this morning and he brought her to the emergency department.  States that she checked her blood pressure overnight multiple times and it was significantly elevated in the 200s which is not normal for her.  Denies any recent falls or trauma.  Denies any change in vision, slurring of speech, extremity numbness or weakness.  No ringing in her ears no change in hearing.  No recent travel.  Endorses some mild ongoing pain behind her left eye that has been ongoing for the past couple of months.  No explained weight loss.  No chest pain or shortness of breath.  No dysuria, urinary urgency or frequency.     Physical Exam   Triage Vital Signs: ED Triage Vitals [09/27/24 0623]  Encounter Vitals Group     BP (!) 194/86     Girls Systolic BP Percentile      Girls Diastolic BP Percentile      Boys Systolic BP Percentile      Boys Diastolic BP Percentile      Pulse Rate 64     Resp 18     Temp 98 F (36.7 C)     Temp Source Oral     SpO2 98 %     Weight      Height      Head Circumference      Peak Flow      Pain Score 0     Pain Loc      Pain  Education      Exclude from Growth Chart     Most recent vital signs: Vitals:   09/27/24 0715 09/27/24 1109  BP:  (!) 159/64  Pulse:  (!) 55  Resp:  16  Temp:  98.1 F (36.7 C)  SpO2: 99% 99%    Physical Exam Constitutional:      Appearance: She is well-developed.  HENT:     Head: Atraumatic.     Mouth/Throat:     Mouth: Mucous membranes are moist.  Eyes:     Extraocular Movements: Extraocular movements intact.     Conjunctiva/sclera: Conjunctivae normal.     Pupils: Pupils are equal, round, and reactive to light.  Cardiovascular:     Rate and Rhythm: Regular rhythm.  Pulmonary:     Effort: No respiratory distress.  Abdominal:     General: There is no distension.     Tenderness: There is no abdominal tenderness.  Musculoskeletal:        General: Normal range of motion.     Cervical back: Normal range of motion.  Skin:    General: Skin  is warm.     Capillary Refill: Capillary refill takes less than 2 seconds.  Neurological:     Mental Status: She is alert. Mental status is at baseline.     GCS: GCS eye subscore is 4. GCS verbal subscore is 5. GCS motor subscore is 6.     Cranial Nerves: Cranial nerves 2-12 are intact.     Sensory: Sensation is intact.     Motor: Motor function is intact.     Coordination: Coordination is intact.     Gait: Gait is intact.  Psychiatric:        Mood and Affect: Mood normal.     IMPRESSION / MDM / ASSESSMENT AND PLAN / ED COURSE  I reviewed the triage vital signs and the nursing notes.  Differential diagnosis including BPPV with peripheral vertigo, CVA, TIA, dissection, electrolyte abnormality, infectious process, malignancy  Patient is also at the window for TNK and currently has an NIH scale of 0.  Does not meet criteria for activated code stroke.  EKG  I, Clotilda Punter, the attending physician, personally viewed and interpreted this ECG.  EKG showed normal sinus rhythm.  Heart rate of 55.  Narrow complex.  Normal  intervals.  No significant ST elevation or depression.  No findings of acute ischemia or dysrhythmia No tachycardic or bradycardic dysrhythmias while on cardiac telemetry.  RADIOLOGY I independently reviewed imaging, my interpretation of imaging: CTA head and neck  MRI brain -no acute findings  LABS (all labs ordered are listed, but only abnormal results are displayed) Labs interpreted as -    Labs Reviewed  CBC - Abnormal; Notable for the following components:      Result Value   RBC 5.35 (*)    HCT 47.0 (*)    All other components within normal limits  BASIC METABOLIC PANEL WITH GFR - Abnormal; Notable for the following components:   Glucose, Bld 122 (*)    All other components within normal limits  TROPONIN T, HIGH SENSITIVITY     MDM   Clinical Course as of 09/27/24 1250  Mon Sep 27, 2024  1020 Patient continues to be asymptomatic and is feeling much better.  States that her symptoms only occurred with change of position of her head.  MRI of the brain with no signs of acute ischemia.  Will obtain a CTA head and neck to further evaluate for dissection or further intracranial abnormalities.  If CTA of the head and neck is negative plan to discharge home with treatment for peripheral vertigo.  Have a very low suspicion for TIA given that her symptoms only occurred with change of head position, lasted a couple of hours and then completely resolved.  Offered admission for TIA/CVA workup however ultimately wanted to go home and follow-up as an outpatient with primary care.  Given a prescription for meclizine  and discussed Epley maneuvers.  Discussed return precautions for any ongoing or worsening symptoms.  Given information for ENT for any recurrent vertigo symptoms and discussed return to the emergency department for any recurrence.  Low suspicion for ACS, troponin is negative and chest pain-free. [SM]  1022 Patient's blood pressure continued to be elevated while in the emergency  department.  No signs of endorgan damage.  Patient is already on antihypertensive medications and followed closely with her primary care physician.  Patient states she has plenty of refills and has not ran out of any medications.  Discussed that she needs to follow-up with her primary care physician within  the next 1 week for blood pressure recheck and to discuss any further changes of antihypertensive medications. [SM]  1247 We talked Dr. Claudene with neurosurgery who also reached out to his vascular team.  Felt that it was unrelated to her symptoms today.  Incidental did not recommend any specific follow-up or specific treatments.  Patient continues to be asymptomatic.  Most likely with positional peripheral cause of her vertigo.  Discussed close follow-up with primary care.  Given a prescription for meclizine  and discussed how to use Epley maneuvers.  Discussed return precautions for any ongoing or worsening symptoms. [SM]    Clinical Course User Index [SM] Suzanne Kirsch, MD     PROCEDURES:  Critical Care performed: No  Procedures  Patient's presentation is most consistent with acute presentation with potential threat to life or bodily function.   MEDICATIONS ORDERED IN ED: Medications  iohexol  (OMNIPAQUE ) 350 MG/ML injection 75 mL (75 mLs Intravenous Contrast Given 09/27/24 1020)    FINAL CLINICAL IMPRESSION(S) / ED DIAGNOSES   Final diagnoses:  Dizziness  Vertigo  Uncontrolled hypertension     Rx / DC Orders   ED Discharge Orders          Ordered    meclizine  (ANTIVERT ) 25 MG tablet  3 times daily PRN        09/27/24 1232             Note:  This document was prepared using Dragon voice recognition software and may include unintentional dictation errors.   Suzanne Kirsch, MD 09/27/24 1023    Suzanne Kirsch, MD 09/27/24 1250

## 2024-09-27 NOTE — Telephone Encounter (Signed)
 Looks to be a follow up on incidental findings of CTA scan from ER. Should patient follow up with PA or Dr Claudene? First available appointment ok?

## 2024-09-27 NOTE — Telephone Encounter (Signed)
 Needing clarification on if this pt needs to be seen here based on the discharge notes? Per AVS it says to f/u with us , but the hospital notes do not state this specifically?

## 2024-09-27 NOTE — Discharge Instructions (Addendum)
 You are seen in the emergency department for an episode of dizziness.  You had an MRI of your brain that did not show any signs of a stroke.  It is most likely coming from an inner ear issue.  You are given a prescription for meclizine  to take as needed for dizziness.  Call your primary care physician to schedule close follow-up appointment.  Your blood pressure was elevated in the emergency department.  Call to schedule close follow-up to make sure you have blood pressure repeated and you do not need a change of your home medications.  You are given information of how to perform Epley maneuvers, you can perform them in the morning, afternoon and at nighttime to see if that will help with your dizziness.  Return to the emergency department for any ongoing or worsening symptoms.  Incidental finding noted on CT scan  -discussed this result with primary care.  He likely will need outpatient follow-up and repeat imaging in the future to make sure that there is no change.    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  3. Generally mild for age atherosclerosis. No large vessel occlusion. 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

## 2024-09-27 NOTE — ED Notes (Signed)
 Pt given Dc instructions. Pt verbalized understanding of medication and follow up care. Pt ambulatory from ED without difficulty.

## 2024-09-27 NOTE — ED Notes (Signed)
 Blue, green, and lav top tube sent to lab

## 2024-09-28 DIAGNOSIS — I729 Aneurysm of unspecified site: Secondary | ICD-10-CM | POA: Diagnosis not present

## 2024-09-28 DIAGNOSIS — R42 Dizziness and giddiness: Secondary | ICD-10-CM | POA: Diagnosis not present

## 2024-10-12 DIAGNOSIS — M7632 Iliotibial band syndrome, left leg: Secondary | ICD-10-CM | POA: Diagnosis not present

## 2024-10-12 DIAGNOSIS — M47816 Spondylosis without myelopathy or radiculopathy, lumbar region: Secondary | ICD-10-CM | POA: Diagnosis not present

## 2024-10-12 DIAGNOSIS — M7062 Trochanteric bursitis, left hip: Secondary | ICD-10-CM | POA: Diagnosis not present

## 2024-10-12 DIAGNOSIS — M79605 Pain in left leg: Secondary | ICD-10-CM | POA: Diagnosis not present

## 2024-12-01 ENCOUNTER — Encounter (INDEPENDENT_AMBULATORY_CARE_PROVIDER_SITE_OTHER): Payer: Self-pay | Admitting: Nurse Practitioner

## 2024-12-01 ENCOUNTER — Ambulatory Visit (INDEPENDENT_AMBULATORY_CARE_PROVIDER_SITE_OTHER): Admitting: Nurse Practitioner

## 2024-12-01 VITALS — BP 150/71 | HR 54 | Resp 18 | Ht 61.0 in | Wt 141.2 lb

## 2024-12-01 DIAGNOSIS — I10 Essential (primary) hypertension: Secondary | ICD-10-CM

## 2024-12-01 DIAGNOSIS — I720 Aneurysm of carotid artery: Secondary | ICD-10-CM

## 2024-12-01 DIAGNOSIS — E119 Type 2 diabetes mellitus without complications: Secondary | ICD-10-CM

## 2024-12-01 DIAGNOSIS — I773 Arterial fibromuscular dysplasia: Secondary | ICD-10-CM | POA: Diagnosis not present

## 2024-12-01 NOTE — Progress Notes (Signed)
 "  Subjective:    Patient ID: Angela Blair, female    DOB: 1951/07/11, 74 y.o.   MRN: [id number] Chief Complaint  Patient presents with   New Patient (Initial Visit)    Ref [place] consult CTA done 09/27/24 aneurysm     HPI  Discussed the use of AI scribe software for clinical note transcription with the patient, who gave verbal consent to proceed.  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  In November 2025, she experienced an acute episode of dizziness and vertigo upon awakening, resulting in a fall. She had no prior history of headaches or similar episodes. She underwent MRI and CT imaging in the emergency department and was diagnosed with vertigo. Her dizziness has since resolved and she has not had recurrent symptoms.  Carotid imaging performed during her evaluation revealed a right carotid artery aneurysm measuring approximately 9 mm and a very small left carotid artery aneurysm, which may not represent a true aneurysm. Imaging also demonstrated mild fibromuscular dysplasia without significant stenosis. She is aware that these findings were discovered incidentally during her workup for dizziness. She denies any history of headaches or other neurological symptoms prior to this event.  She has hypertension and is treated with antihypertensive medications, which she takes daily. Despite adherence, her blood pressure remains elevated, with systolic values ranging from 150 to 165 mmHg.    Results Radiology Carotid artery ultrasound (09/2024): Right carotid artery aneurysm measuring 9 mm, left carotid artery possible small aneurysm, mild fibromuscular dysplasia, minimal bilateral stenosis Head MRI: No acute findings Head CT: No acute findings   Review of Systems  Neurological:  Negative for  dizziness.  All other systems reviewed and are negative.      Objective:   Physical Exam HENT:     Head: Normocephalic.  Neck:     Vascular: No carotid bruit.  Cardiovascular:     Rate and Rhythm: Normal rate.     Pulses: Normal pulses.  Pulmonary:     Effort: Pulmonary effort is normal.  Skin:    General: Skin is warm and dry.  Neurological:     Mental Status: She is alert and oriented to person, place, and time.  Psychiatric:        Mood and Affect: Mood normal.        Behavior: Behavior normal.        Thought Content: Thought content normal.     Physical Exam VITALS: BP- 150/71  BP (!) 150/71 (BP Location: Left Arm)   Pulse (!) 54   Resp 18   Ht 5' 1 (1.549 m)   Wt 141 lb 3.2 oz (64 kg)   BMI 26.68 kg/m   Past Medical History:  Diagnosis Date   Complication of anesthesia    after bowel surgery pt states she was talking out of her head   Hypercholesterolemia    Hypertension    mild, does not want to take medications   PONV (postoperative nausea and vomiting)     Social History   Socioeconomic History   Marital status: Married    Spouse name: Not on file   Number of children: Not on file   Years of education: Not on file   Highest education level: Not on file  Occupational History   Not on file  Tobacco Use   Smoking status: Never  Smokeless tobacco: Never  Vaping Use   Vaping status: Never Used  Substance and Sexual Activity   Alcohol use: Never   Drug use: Never   Sexual activity: Not on file  Other Topics Concern   Not on file  Social History Narrative   Not on file   Social Drivers of Health   Tobacco Use: Low Risk (12/01/2024)   Patient History    Smoking Tobacco Use: Never    Smokeless Tobacco Use: Never    Passive Exposure: Not on file  Financial Resource Strain: Low Risk  (02/17/2024)   Received from [facility]   Overall Financial Resource Strain (CARDIA)    Difficulty of Paying Living Expenses: Not hard  at all  Food Insecurity: No Food Insecurity (02/17/2024)   Received from [facility]   Epic    Within the past 12 months, you worried that your food would run out before you got the money to buy more.: Never true    Within the past 12 months, the food you bought just didn't last and you didn't have money to get more.: Never true  Transportation Needs: No Transportation Needs (02/17/2024)   Received from [facility] - Transportation    In the past 12 months, has lack of transportation kept you from medical appointments or from getting medications?: No    Lack of Transportation (Non-Medical): No  Physical Activity: Not on file  Stress: Not on file  Social Connections: Not on file  Intimate Partner Violence: Not on file  Depression (EYV7-0): Not on file  Alcohol Screen: Not on file  Housing: Low Risk  (02/17/2024)   Received from [facility]   Epic    In the last 12 months, was there a time when you were not able to pay the mortgage or rent on time?: No    In the past 12 months, how many times have you moved where you were living?: 0    At any time in the past 12 months, were you homeless or living in a shelter (including now)?: No  Utilities: Not At Risk (02/17/2024)   Received from [facility] Utilities    Threatened with loss of utilities: No  Health Literacy: Not on file    Past Surgical History:  Procedure Laterality Date   ABDOMINAL HYSTERECTOMY     APPENDECTOMY     BREAST BIOPSY Left 08/19/2023   US  LT BREAST BX W LOC DEV 1ST LESION IMG BX SPEC US  GUIDE 08/19/2023 [facility]   COLONOSCOPY  2013   LAPAROTOMY N/A 05/05/2018   Procedure: EXPLORATORY LAPAROTOMY with small bowel resection, removal of foreign body;  Surgeon: Jordis Laneta FALCON, MD;  Location: [facility] ORS;  Service: General;  Laterality: N/A;  photo taken of foreign body and placed on chart foreign body disposed of in sharps  container   VENTRAL HERNIA REPAIR N/A 06/08/2019   Procedure: HERNIA REPAIR VENTRAL ADULT, OPEN, ABDOMINAL WALL RECONSTRUCTION;  Surgeon: Jordis Laneta FALCON, MD;  Location: [facility] ORS;  Service: General;  Laterality: N/A;    Family History  Problem Relation Age of Onset   Breast cancer Neg Hx     Allergies[1]     Latest Ref Rng & Units 09/27/2024    6:27 AM 06/09/2019    6:26 AM 06/08/2019    5:45 PM  CBC  WBC 4.0 - 10.5 K/uL 6.4  8.9  10.2   Hemoglobin  12.0 - 15.0 g/dL 84.9  87.6  86.0   Hematocrit 36.0 - 46.0 % 47.0  38.3  44.0   Platelets 150 - 400 K/uL 278  242  263       CMP     Component Value Date/Time   NA 141 09/27/2024 0912   K 4.2 09/27/2024 0912   CL 108 09/27/2024 0912   CO2 25 09/27/2024 0912   GLUCOSE 122 (H) 09/27/2024 0912   BUN 13 09/27/2024 0912   CREATININE 0.58 09/27/2024 0912   CALCIUM 9.1 09/27/2024 0912   PROT 7.2 10/16/2018 1859   ALBUMIN  3.9 10/16/2018 1859   AST 39 10/16/2018 1859   ALT 76 (H) 10/16/2018 1859   ALKPHOS 67 10/16/2018 1859   BILITOT 0.8 10/16/2018 1859   GFRNONAA >60 09/27/2024 0912     No results found.     Assessment & Plan:   1. Carotid aneurysm, right (Primary) Right carotid artery aneurysm 9 mm aneurysm with low rupture risk. Surgical intervention not indicated unless it reaches 2 cm. Surveillance recommended. - Repeat carotid ultrasound in three months to monitor size and stability. - If stable, extend surveillance to every six months, then annually with continued stability.  Possible left carotid artery aneurysm Possible very small aneurysm, may not be true aneurysm. No intervention indicated. - Repeat carotid ultrasound in three months to reassess. - VAS US  CAROTID; Future  2. Essential hypertension Continue antihypertensive medications as already ordered, these medications have been reviewed and there are no changes at this time.  3. Diabetes mellitus without complication (HCC) Continue hypoglycemic  medications as already ordered, these medications have been reviewed and there are no changes at this time.  Hgb A1C to be monitored as already arranged by primary service   4. Fibromuscular dysplasia of both carotid arteries Fibromuscular dysplasia Mild FMD with minimal arterial narrowing, no significant stenosis or symptoms. Likely genetic. No intervention required. - Monitor for progression with serial carotid ultrasounds as scheduled for aneurysm surveillance.  Assessment and Plan Assessment & Plan       Medications Ordered Prior to Encounter[2]  There are no Patient Instructions on file for this visit. Return in about 3 months (around 03/01/2025) for Carotid 3 months JD/FB.   Caitlin Ainley E Jozee Hammer, NP      [1]  Allergies Allergen Reactions   Statins Other (See Comments)    Intolerance - arthralgia  [2]  Current Outpatient Medications on File Prior to Visit  Medication Sig Dispense Refill   cholecalciferol (VITAMIN D3) 25 MCG (1000 UT) tablet Take 1,000 Units by mouth daily.     ezetimibe  (ZETIA ) 10 MG tablet Take 10 mg by mouth daily.      losartan  (COZAAR ) 50 MG tablet Take 50 mg by mouth daily.     meclizine  (ANTIVERT ) 25 MG tablet Take 1 tablet (25 mg total) by mouth 3 (three) times daily as needed for dizziness. 30 tablet 0   predniSONE  (STERAPRED UNI-PAK 21 TAB) 10 MG (21) TBPK tablet 6,5,4,3,2,1 (Patient not taking: Reported on 12/01/2024) 21 tablet 0   No current facility-administered medications on file prior to visit.   "

## 2025-03-02 ENCOUNTER — Ambulatory Visit (INDEPENDENT_AMBULATORY_CARE_PROVIDER_SITE_OTHER): Admitting: Nurse Practitioner

## 2025-03-02 ENCOUNTER — Encounter (INDEPENDENT_AMBULATORY_CARE_PROVIDER_SITE_OTHER)
